# Patient Record
Sex: Female | Born: 1955 | Race: White | Hispanic: No | Marital: Married | Smoking: Never smoker
Health system: Southern US, Community
[De-identification: ages and names within clinical notes are randomized; demographics above are authoritative.]

## PROBLEM LIST (undated history)

## (undated) DIAGNOSIS — R5383 Other fatigue: Secondary | ICD-10-CM

## (undated) DIAGNOSIS — C50919 Malignant neoplasm of unspecified site of unspecified female breast: Secondary | ICD-10-CM

## (undated) DIAGNOSIS — R112 Nausea with vomiting, unspecified: Secondary | ICD-10-CM

## (undated) DIAGNOSIS — Z923 Personal history of irradiation: Secondary | ICD-10-CM

## (undated) DIAGNOSIS — R109 Unspecified abdominal pain: Secondary | ICD-10-CM

## (undated) DIAGNOSIS — R5381 Other malaise: Secondary | ICD-10-CM

## (undated) DIAGNOSIS — D649 Anemia, unspecified: Secondary | ICD-10-CM

## (undated) DIAGNOSIS — M5030 Other cervical disc degeneration, unspecified cervical region: Secondary | ICD-10-CM

## (undated) DIAGNOSIS — Z1331 Encounter for screening for depression: Secondary | ICD-10-CM

## (undated) DIAGNOSIS — E039 Hypothyroidism, unspecified: Secondary | ICD-10-CM

## (undated) DIAGNOSIS — K219 Gastro-esophageal reflux disease without esophagitis: Secondary | ICD-10-CM

## (undated) DIAGNOSIS — J069 Acute upper respiratory infection, unspecified: Secondary | ICD-10-CM

## (undated) DIAGNOSIS — J309 Allergic rhinitis, unspecified: Secondary | ICD-10-CM

## (undated) DIAGNOSIS — R300 Dysuria: Secondary | ICD-10-CM

## (undated) DIAGNOSIS — G43009 Migraine without aura, not intractable, without status migrainosus: Secondary | ICD-10-CM

## (undated) DIAGNOSIS — R350 Frequency of micturition: Secondary | ICD-10-CM

## (undated) DIAGNOSIS — K5909 Other constipation: Secondary | ICD-10-CM

## (undated) DIAGNOSIS — R110 Nausea: Secondary | ICD-10-CM

## (undated) DIAGNOSIS — I872 Venous insufficiency (chronic) (peripheral): Secondary | ICD-10-CM

## (undated) DIAGNOSIS — I517 Cardiomegaly: Secondary | ICD-10-CM

## (undated) DIAGNOSIS — R0609 Other forms of dyspnea: Secondary | ICD-10-CM

## (undated) DIAGNOSIS — J029 Acute pharyngitis, unspecified: Secondary | ICD-10-CM

## (undated) DIAGNOSIS — K449 Diaphragmatic hernia without obstruction or gangrene: Secondary | ICD-10-CM

## (undated) DIAGNOSIS — Z9889 Other specified postprocedural states: Secondary | ICD-10-CM

## (undated) HISTORY — DX: Other malaise: R53.83

## (undated) HISTORY — DX: Other forms of dyspnea: R06.09

## (undated) HISTORY — DX: Gastro-esophageal reflux disease without esophagitis: K21.9

## (undated) HISTORY — DX: Dysuria: R30.0

## (undated) HISTORY — DX: Acute upper respiratory infection, unspecified: J06.9

## (undated) HISTORY — DX: Frequency of micturition: R35.0

## (undated) HISTORY — DX: Diaphragmatic hernia without obstruction or gangrene: K44.9

## (undated) HISTORY — DX: Other constipation: K59.09

## (undated) HISTORY — DX: Other malaise: R53.81

## (undated) HISTORY — DX: Allergic rhinitis, unspecified: J30.9

## (undated) HISTORY — DX: Encounter for screening for depression: Z13.31

## (undated) HISTORY — DX: Venous insufficiency (chronic) (peripheral): I87.2

## (undated) HISTORY — PX: ABDOMINAL HYSTERECTOMY: SHX81

## (undated) HISTORY — DX: Hypothyroidism, unspecified: E03.9

## (undated) HISTORY — DX: Personal history of irradiation: Z92.3

## (undated) HISTORY — DX: Migraine without aura, not intractable, without status migrainosus: G43.009

## (undated) HISTORY — DX: Cardiomegaly: I51.7

## (undated) HISTORY — DX: Unspecified abdominal pain: R10.9

## (undated) HISTORY — DX: Other cervical disc degeneration, unspecified cervical region: M50.30

## (undated) HISTORY — DX: Nausea: R11.0

## (undated) HISTORY — DX: Acute pharyngitis, unspecified: J02.9

---

## 1998-06-11 ENCOUNTER — Other Ambulatory Visit: Admission: RE | Admit: 1998-06-11 | Discharge: 1998-06-11 | Payer: Self-pay | Admitting: Gynecology

## 1998-07-13 ENCOUNTER — Other Ambulatory Visit: Admission: RE | Admit: 1998-07-13 | Discharge: 1998-07-13 | Payer: Self-pay | Admitting: Gynecology

## 1998-10-24 ENCOUNTER — Other Ambulatory Visit: Admission: RE | Admit: 1998-10-24 | Discharge: 1998-10-24 | Payer: Self-pay | Admitting: Gynecology

## 1999-06-05 ENCOUNTER — Other Ambulatory Visit: Admission: RE | Admit: 1999-06-05 | Discharge: 1999-06-05 | Payer: Self-pay | Admitting: Gynecology

## 1999-06-11 ENCOUNTER — Encounter: Admission: RE | Admit: 1999-06-11 | Discharge: 1999-06-11 | Payer: Self-pay | Admitting: Gynecology

## 1999-06-11 ENCOUNTER — Encounter: Payer: Self-pay | Admitting: Gynecology

## 2000-06-08 ENCOUNTER — Other Ambulatory Visit: Admission: RE | Admit: 2000-06-08 | Discharge: 2000-06-08 | Payer: Self-pay | Admitting: Gynecology

## 2000-06-12 ENCOUNTER — Encounter: Admission: RE | Admit: 2000-06-12 | Discharge: 2000-06-12 | Payer: Self-pay | Admitting: Gynecology

## 2000-06-12 ENCOUNTER — Encounter: Payer: Self-pay | Admitting: Gynecology

## 2001-02-26 ENCOUNTER — Ambulatory Visit (HOSPITAL_COMMUNITY): Admission: RE | Admit: 2001-02-26 | Discharge: 2001-02-26 | Payer: Self-pay | Admitting: Gastroenterology

## 2001-06-07 ENCOUNTER — Other Ambulatory Visit: Admission: RE | Admit: 2001-06-07 | Discharge: 2001-06-07 | Payer: Self-pay | Admitting: Gynecology

## 2001-06-23 ENCOUNTER — Encounter: Payer: Self-pay | Admitting: Gynecology

## 2001-06-23 ENCOUNTER — Encounter: Admission: RE | Admit: 2001-06-23 | Discharge: 2001-06-23 | Payer: Self-pay | Admitting: Gynecology

## 2001-12-03 ENCOUNTER — Ambulatory Visit (HOSPITAL_COMMUNITY): Admission: RE | Admit: 2001-12-03 | Discharge: 2001-12-03 | Payer: Self-pay | Admitting: *Deleted

## 2001-12-03 ENCOUNTER — Encounter (INDEPENDENT_AMBULATORY_CARE_PROVIDER_SITE_OTHER): Payer: Self-pay | Admitting: Specialist

## 2002-01-20 HISTORY — PX: SALPINGOOPHORECTOMY: SHX82

## 2002-06-13 ENCOUNTER — Other Ambulatory Visit: Admission: RE | Admit: 2002-06-13 | Discharge: 2002-06-13 | Payer: Self-pay | Admitting: Gynecology

## 2002-06-28 ENCOUNTER — Encounter: Admission: RE | Admit: 2002-06-28 | Discharge: 2002-06-28 | Payer: Self-pay | Admitting: Gynecology

## 2002-06-28 ENCOUNTER — Encounter: Payer: Self-pay | Admitting: Gynecology

## 2002-11-08 ENCOUNTER — Encounter (INDEPENDENT_AMBULATORY_CARE_PROVIDER_SITE_OTHER): Payer: Self-pay

## 2002-11-09 ENCOUNTER — Inpatient Hospital Stay (HOSPITAL_COMMUNITY): Admission: RE | Admit: 2002-11-09 | Discharge: 2002-11-10 | Payer: Self-pay | Admitting: Gynecology

## 2003-01-21 HISTORY — PX: LUMBAR LAMINECTOMY: SHX95

## 2003-06-15 ENCOUNTER — Other Ambulatory Visit: Admission: RE | Admit: 2003-06-15 | Discharge: 2003-06-15 | Payer: Self-pay | Admitting: Gynecology

## 2003-06-29 ENCOUNTER — Encounter: Admission: RE | Admit: 2003-06-29 | Discharge: 2003-06-29 | Payer: Self-pay | Admitting: Gynecology

## 2003-08-01 ENCOUNTER — Encounter: Admission: RE | Admit: 2003-08-01 | Discharge: 2003-08-01 | Payer: Self-pay | Admitting: Family Medicine

## 2003-08-02 ENCOUNTER — Encounter: Admission: RE | Admit: 2003-08-02 | Discharge: 2003-08-02 | Payer: Self-pay | Admitting: Family Medicine

## 2003-08-06 ENCOUNTER — Emergency Department (HOSPITAL_COMMUNITY): Admission: EM | Admit: 2003-08-06 | Discharge: 2003-08-06 | Payer: Self-pay | Admitting: Emergency Medicine

## 2003-08-07 ENCOUNTER — Ambulatory Visit (HOSPITAL_COMMUNITY): Admission: RE | Admit: 2003-08-07 | Discharge: 2003-08-08 | Payer: Self-pay | Admitting: Neurosurgery

## 2004-01-21 HISTORY — PX: THYROID SURGERY: SHX805

## 2004-06-19 ENCOUNTER — Other Ambulatory Visit: Admission: RE | Admit: 2004-06-19 | Discharge: 2004-06-19 | Payer: Self-pay | Admitting: Gynecology

## 2004-07-01 ENCOUNTER — Encounter: Admission: RE | Admit: 2004-07-01 | Discharge: 2004-07-01 | Payer: Self-pay | Admitting: Gynecology

## 2004-07-05 ENCOUNTER — Encounter: Admission: RE | Admit: 2004-07-05 | Discharge: 2004-07-05 | Payer: Self-pay | Admitting: Gynecology

## 2004-07-12 ENCOUNTER — Encounter: Admission: RE | Admit: 2004-07-12 | Discharge: 2004-07-12 | Payer: Self-pay | Admitting: Gynecology

## 2004-07-31 ENCOUNTER — Encounter (INDEPENDENT_AMBULATORY_CARE_PROVIDER_SITE_OTHER): Payer: Self-pay | Admitting: *Deleted

## 2004-07-31 ENCOUNTER — Encounter: Admission: RE | Admit: 2004-07-31 | Discharge: 2004-07-31 | Payer: Self-pay | Admitting: Gynecology

## 2004-07-31 ENCOUNTER — Other Ambulatory Visit: Admission: RE | Admit: 2004-07-31 | Discharge: 2004-07-31 | Payer: Self-pay | Admitting: Interventional Radiology

## 2004-08-16 ENCOUNTER — Emergency Department (HOSPITAL_COMMUNITY): Admission: EM | Admit: 2004-08-16 | Discharge: 2004-08-17 | Payer: Self-pay | Admitting: Emergency Medicine

## 2004-10-04 ENCOUNTER — Encounter (INDEPENDENT_AMBULATORY_CARE_PROVIDER_SITE_OTHER): Payer: Self-pay | Admitting: Specialist

## 2004-10-04 ENCOUNTER — Ambulatory Visit (HOSPITAL_COMMUNITY): Admission: RE | Admit: 2004-10-04 | Discharge: 2004-10-04 | Payer: Self-pay | Admitting: Endocrinology

## 2004-11-19 ENCOUNTER — Encounter (INDEPENDENT_AMBULATORY_CARE_PROVIDER_SITE_OTHER): Payer: Self-pay | Admitting: Specialist

## 2004-11-19 ENCOUNTER — Ambulatory Visit (HOSPITAL_COMMUNITY): Admission: RE | Admit: 2004-11-19 | Discharge: 2004-11-20 | Payer: Self-pay | Admitting: *Deleted

## 2005-06-24 ENCOUNTER — Other Ambulatory Visit: Admission: RE | Admit: 2005-06-24 | Discharge: 2005-06-24 | Payer: Self-pay | Admitting: Gynecology

## 2005-07-03 ENCOUNTER — Encounter: Admission: RE | Admit: 2005-07-03 | Discharge: 2005-07-03 | Payer: Self-pay | Admitting: Gynecology

## 2006-01-20 HISTORY — PX: SHOULDER ARTHROSCOPY: SHX128

## 2006-07-02 ENCOUNTER — Other Ambulatory Visit: Admission: RE | Admit: 2006-07-02 | Discharge: 2006-07-02 | Payer: Self-pay | Admitting: Gynecology

## 2006-07-09 ENCOUNTER — Encounter: Admission: RE | Admit: 2006-07-09 | Discharge: 2006-07-09 | Payer: Self-pay | Admitting: Gynecology

## 2006-08-04 ENCOUNTER — Ambulatory Visit (HOSPITAL_COMMUNITY): Admission: RE | Admit: 2006-08-04 | Discharge: 2006-08-05 | Payer: Self-pay | Admitting: Orthopedic Surgery

## 2007-06-02 ENCOUNTER — Encounter: Admission: RE | Admit: 2007-06-02 | Discharge: 2007-06-02 | Payer: Self-pay | Admitting: Gynecology

## 2007-08-05 ENCOUNTER — Encounter: Admission: RE | Admit: 2007-08-05 | Discharge: 2007-08-05 | Payer: Self-pay | Admitting: Gynecology

## 2007-08-11 ENCOUNTER — Encounter: Admission: RE | Admit: 2007-08-11 | Discharge: 2007-08-11 | Payer: Self-pay | Admitting: Gynecology

## 2008-02-25 ENCOUNTER — Encounter: Admission: RE | Admit: 2008-02-25 | Discharge: 2008-02-25 | Payer: Self-pay | Admitting: Gynecology

## 2008-07-17 ENCOUNTER — Encounter: Admission: RE | Admit: 2008-07-17 | Discharge: 2008-07-17 | Payer: Self-pay | Admitting: Endocrinology

## 2008-08-07 ENCOUNTER — Encounter: Admission: RE | Admit: 2008-08-07 | Discharge: 2008-08-07 | Payer: Self-pay | Admitting: Gynecology

## 2009-08-08 ENCOUNTER — Encounter: Admission: RE | Admit: 2009-08-08 | Discharge: 2009-08-08 | Payer: Self-pay | Admitting: Gynecology

## 2010-04-24 ENCOUNTER — Ambulatory Visit
Admission: RE | Admit: 2010-04-24 | Discharge: 2010-04-24 | Disposition: A | Discharge status: Home / Self Care | Payer: 59 | Source: Ambulatory Visit | Attending: Internal Medicine | Admitting: Internal Medicine

## 2010-04-24 ENCOUNTER — Other Ambulatory Visit: Payer: Self-pay | Admitting: Internal Medicine

## 2010-04-24 DIAGNOSIS — R1031 Right lower quadrant pain: Secondary | ICD-10-CM

## 2010-04-24 MED ORDER — IOHEXOL 300 MG/ML  SOLN
100.0000 mL | Freq: Once | INTRAMUSCULAR | Status: AC | PRN
  Administered 2010-04-24: 100 mL via INTRAVENOUS

## 2010-06-04 NOTE — Op Note (Signed)
Jennifer Rodgers, Jennifer Rodgers                 ACCOUNT NO.:  [id number]   MEDICAL RECORD NO.:  [id number]          PATIENT TYPE:  AMB   LOCATION:  DAY                          FACILITY:  [facility]   PHYSICIAN:  Marlowe Kays, M.D.  DATE OF BIRTH:  01-Jun-1955   DATE OF PROCEDURE:  08/04/2006  DATE OF DISCHARGE:                               OPERATIVE REPORT   PREOPERATIVE DIAGNOSIS:  Torn rotator cuff, left shoulder.   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   OPERATION:  Anterior acromionectomy, exploration of rotator cuff with  excision of exuberant bursitis and injection of glenohumeral joint with  methylene blue.   SURGEON:  [place], M.D.   ASSISTANT:  Mr. Idolina Primer, P.A.-C.   ANESTHESIA:  General.   PATHOLOGY AND INDICATIONS FOR PROCEDURE:  Because of left shoulder pain,  she had an MRI performed on June 22, 2006 which I have personally  reviewed which showed a fairly significant rotator cuff tear along the  axis of the tendon which appeared to be more of a filleting type of tear  with extensive linear tears.  The tendon appeared to be shredded but not  retracted.  See operative description below for details at surgery.   PROCEDURE:  Prophylactic antibiotics, satisfactory general anesthesia,  time-out performed, beach-chair position on the [place] frame.  Left  shoulder girdle was prepped with DuraPrep and draped in a sterile field.  Vertical incision from roughly the [facility] joint down to the greater  tuberosity.  Fascia over the anterior acromion was opened in line with  the skin incision with cutting cautery.  On undermining the anterior  acromion, no joint fluid came forth.  I performed my initial anterior  acromionectomy with a micro saw protecting the undersurface of the  rotator cuff and then made additional decompression with a saw and  rongeur so that she had a thorough subacromial decompression.  She had  extensive  subdeltoid bursitis in which I excised with scissors.  Some  areas of bleeding were corrected with cautery.  This was more of  bursitis and almost a reparative type bursal tissue.  On inspection of  the rotator cuff, I could not find any definite tears anteriorly and in  the supraspinatus which was where the abnormalities were felt to lie on  the MRI.  There was some scarring which was suggestive healed rotator  cuff tears.  I injected methylene blue mixed with saline through the  anterior rotator cuff into the joint.  There was good pressure  maintained and no leakage of the dye which indicated to me that the  tears had healed.  Accordingly, with impingement corrected and no tear  to repair, I felt that the goals of the surgery had been accomplished.  I irrigated the wound well with sterile saline, infiltrated the soft  tissues with half percent Marcaine with adrenalin.  The deltoid was  repaired with interrupted #1  Vicryl as was the fascia over the anterior acromion, subcutaneous tissue  with 2-0 Vicryl, Steri-Strips on skin.  Dry sterile dressing and  shoulder  immobilizer applied.  She tolerated the procedure well and was  taken to recovery in satisfactory condition with no known complications.           ______________________________  Marlowe Kays, M.D.     JA/MEDQ  D:  08/04/2006  T:  08/04/2006  Job:  [id number]

## 2010-06-07 NOTE — Op Note (Signed)
   NAME:  Jennifer Rodgers, Jennifer Rodgers                           ACCOUNT NO.:  [id number]   MEDICAL RECORD NO.:  [id number]                   PATIENT TYPE:  AMB   LOCATION:  DAY                                  FACILITY:  [facility]   PHYSICIAN:  Vikki Ports, M.D.         DATE OF BIRTH:  10-24-1955   DATE OF PROCEDURE:  12/03/2001  DATE OF DISCHARGE:                                 OPERATIVE REPORT   PREOPERATIVE DIAGNOSIS:  Right groin mass.   POSTOPERATIVE DIAGNOSIS:  Right groin mass.   PROCEDURE:  Excision right groin lipoma.   ANESTHESIA:  Local MAC.   SURGEON:  Vikki Ports, M.D.   DESCRIPTION OF PROCEDURE:  The patient was taken to the operating room,  placed in the supine position. After adequate MAC anesthesia was induced,  the right groin was prepped and draped in normal sterile fashion. The skin  and subcu was anesthetized using 0.5 Marcaine with epinephrine. A small  incision was made just in the inguinal fold just under the dermis. A well  encapsulated lipoma was seen, it was delivered up and out through the wound  using blunt and sharp dissection. Adequate hemostasis was ensured and the  skin was closed with a 4-0 Monocryl and Dermabond was placed. The patient  tolerated the procedure well and went to PACU in good condition.                                               Vikki Ports, M.D.    KRH/MEDQ  D:  12/03/2001  T:  12/03/2001  Job:  [id number]

## 2010-06-07 NOTE — Op Note (Signed)
NAME:  Jennifer Rodgers, Jennifer Rodgers                           ACCOUNT NO.:  [id number]   MEDICAL RECORD NO.:  [id number]                   PATIENT TYPE:  OIB   LOCATION:  3006                                 FACILITY:  [facility]   PHYSICIAN:  Coletta Memos, M.D.                  DATE OF BIRTH:  1955/10/03   DATE OF PROCEDURE:  08/07/2003  DATE OF DISCHARGE:                                 OPERATIVE REPORT   PREOPERATIVE DIAGNOSES:  1. Bilateral disk herniation at L4-5 right.  2. Right L4 radiculopathy.   POSTOPERATIVE DIAGNOSIS:  1. Bilateral disk herniation at L4-5 right.  2. Right L4 radiculopathy.   OPERATION PERFORMED:  Right L4-5 far lateral diskectomy with  microdissection, partial superior facetectomy, L5.   SURGEON:  Coletta Memos, M.D.   ASSISTANT:  Hewitt Shorts, M.D.   ANESTHESIA:  General.   COMPLICATIONS:  None.   INDICATIONS FOR PROCEDURE:  [name] is a 55 year old who has been in  severe pain in the right lower extremity for the last few days.  The pain  was so severe she said she had to go to the emergency room yesterday.  MRI  strongly suggests far lateral disk herniation at L4-5.  I therefore  recommended and she agreed to undergo operative decompression.   DESCRIPTION OF PROCEDURE:  [name] was brought to the operating room.  She  was intubated and placed under general anesthetic without difficulty.  She  was rolled prone onto a Wilson frame and all pressure points were properly  padded.  Her skin was prepped and she was draped in sterile fashion.  I  infiltrated 20 mL of 0.5% lidocaine and 1:200,000 strength epinephrine into  the lumbar region and paraspinous musculature on the right side.  I opened  the skin with a #10 blade and took this down to the thoracolumbar fascia.  I  then exposed the lamina of L4 and of L3.  Using x-ray as a guide, I was able  to positively identify that I was inferior to the lamina of L3.  I then took  that opportunity to expose the  pars interarticularis of L4.  I used a high  speed drill and Kerrison punches to do a partial removal of the lateral  aspect of the pars of L4. I also removed a small portion of the superior  facet at L4-5.  With the microscope and using microdissection, I was able to  identify the nerve root. I took another x-ray to ensure that I was at the  correct disk space and I was.  I then was able to appreciate what was a very  large hump anterior to the L4 nerve root.  I was able to open that with a  simple nerve hook.  I then removed large portions of disk material.  This  was done with Dr. Earl Gala assistance.  We then inspected the  nerve root  and there was obvious decompression  of the nerve root and I felt that there was no other compression present.  I  then irrigated the wound.  I then placed Depo-Medrol and fentanyl over the  operative site.  I then closed the wound in layered fashion with Dr.  Earl Gala assistance using Vicryl sutures.  Dermabond was used for sterile  dressing.  The patient tolerated the procedure well.                                               Coletta Memos, M.D.    KC/MEDQ  D:  08/07/2003  T:  08/08/2003  Job:  [id number]

## 2010-06-07 NOTE — Op Note (Signed)
Jennifer Rodgers, Jennifer Rodgers                 ACCOUNT NO.:  [id number]   MEDICAL RECORD NO.:  [id number]          PATIENT TYPE:  OIB   LOCATION:  0098                         FACILITY:  [facility]   PHYSICIAN:  Sharlet Salina T. Hoxworth, M.D.DATE OF BIRTH:  1955/10/27   DATE OF PROCEDURE:  DATE OF DISCHARGE:  11/19/2004                                 OPERATIVE REPORT   PREOPERATIVE DIAGNOSIS:  Left thyroid nodule.   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   SURGICAL PROCEDURES:  Left thyroid lobectomy.   SURGEON:  Lorne Skeens. Hoxworth, M.D.   ASSISTANT:  Anselm Pancoast. Zachery Dakins, M.D.   ANESTHESIA:  General.   BRIEF HISTORY:  Ms. Bultman is a 55 year old female who has presented with a  left thyroid nodule that has enlarged rather rapidly over several months.  She now has an approximately 3 cm discrete solid left thyroid nodule.  She  has had two fine-needle aspiration biopsies showing follicular and Hurthle  cells and follicular variant of papillary cancer could not be ruled out.  Due to size a rapid growth and findings on biopsy and physical examination,  we  elected proceed with left thyroid lobectomy and possible total  thyroidectomy depending on the frozen section findings. Of note is the  patient has been somewhat hoarse in recent weeks and has was noted to be  hoarse in the office on preoperative physical exam.   DESCRIPTION OF PROCEDURE:  The patient was brought to the operating room and  placed in the supine position on the operating table.  General endotracheal  anesthesia was induced.  She was carefully positioned with her neck extended  and the entire anterior neck and upper chest up to the jaw were sterilely  prepped and draped.  Correct patient and procedure were verified.  The  curvilinear incision was made in the skin crease two fingerbreadths above  the sternal notch transversely and dissection carried down through the  subcutaneous tissue and  platysma using cautery.  Subplatysmal flaps were  then raised superiorly up to the thyroid cartilage and inferiorly down to  the sternal notch and laterally out to the sternocleidomastoid muscles.  The  Moore retractor was placed.  The strap muscles were divided along the  midline with cautery, and the anterior left lobe of thyroid was exposed. The  left lobe was carefully exposed with blunt dissection up. There was a good-  sized solid nodule in the mid left lobe.  The middle thyroid vein was  divided between clips. The vein was further mobilized up anteriorly.  The  superior pole was then exposed and mobilized out to the superior vessels  which were ligated in continuity with 2-0 silk.  Following this, the vessels  were divided between two clamps and further tied with 2-0 silk and the upper  pole was further mobilized with cautery.  Careful dissection was then  performed in the tracheoesophageal groove and up toward the inferior thyroid  vessels, and we were able to clearly identify the recurrent laryngeal nerve  on the left along its course and it was  visualized and protected throughout  the remainder of the dissection.  Of note, I did see what appeared to be a  normal-appearing parathyroid gland near the upper pole dissection, which was  left in place.  Dissecting near the individual branches of the inferior  thyroid artery, we did encounter a subdural mass about 0.75 cm which  appeared initially consistent with possibly enlarged parathyroid gland.  Due  to its size, we did a biopsy of this, and this showed normal lymph node.  Following this was excised with the rest of the specimen. Individual small  branches of the inferior thyroid artery were then divided between clips.  The gland was mobilized up out of the tracheoesophageal groove. Further  attachments inferiorly well away from the nerve were divided with cautery  between clips.  Finally, the isthmus gland was mobilized anteriorly  with the  nerve inside.  The suspensory ligament of Allyson Sabal was further divided with  cautery and the gland mobilized to the isthmus which was divided between  clamps and the specimen removed. Frozen section on the nodule showed a  benign lymph node as noted above.  Frozen section on the thyroid gland  itself revealed a benign-appearing Hurthle cell lesion most consistent with  a Hurthle cell adenoma.  The right lobe of the thyroid had been exposed  mobilized away for strap muscles, and it was small without any nodularity or  abnormality noted.  We elected to leave the right thyroid lobe.  The neck  was irrigated and complete hemostasis assured.  A small Surgicel patch was  placed in the bed of the left thyroid lobe. The strap muscles were then  closed in the midline with interrupted 3-0 Vicryl.  The platysma was closed  with interrupted 3-0 Vicryl, and the skin was closed with staples and Steri-  Strips.  Sponge, needle and instrument counts were correct.  Dry, sterile  dressings were applied, and the patient taken to recovery in good condition.      Lorne Skeens. Hoxworth, M.D.  Electronically Signed     BTH/MEDQ  D:  11/19/2004  T:  11/19/2004  Job:  [id number]   cc:   Leonie Man, M.D.  [address]  [address]  [place]  [place] [address]

## 2010-06-07 NOTE — Op Note (Signed)
NAME:  Jennifer Rodgers, Jennifer Rodgers                           ACCOUNT NO.:  [id number]   MEDICAL RECORD NO.:  [id number]                   PATIENT TYPE:  AMB   LOCATION:  DAY                                  FACILITY:  [facility]   PHYSICIAN:  Gretta Cool, M.D.              DATE OF BIRTH:  [birth year]   DATE OF PROCEDURE:  DATE OF DISCHARGE:                                 OPERATIVE REPORT   PREOPERATIVE DIAGNOSES:  1. Incapacitating cyclic pelvic pain.  2. History of hysteroscopy resection ablation for endometrial polyps in     1999.  3. Ultrasound evidence of adenomyosis.   POSTOPERATIVE DIAGNOSES:  1. Incapacitating cyclic pelvic pain.  2. History of hysteroscopy resection ablation for endometrial polyps in     1999.  3. Ultrasound evidence of adenomyosis.   PROCEDURE:  Total abdominal hysterectomy and bilateral salpingo-  oophorectomy.   SURGEON:  Gretta Cool, M.D.   ASSISTANT:  Raynald Kemp, M.D.   ANESTHESIA:  General orotracheal.   DESCRIPTION OF PROCEDURE:  Under excellent general anesthesia with the  patient's abdomen prepped and draped as a sterile field, a Pfannenstiel  incision was made and the incision extended through the fascia.  The abdomen  was then opened and the abdomen explored.  There were no abnormalities  identified in the upper abdomen.  Examination of the pelvis revealed small  amount of endometriosis involving both ovaries, both ovaries relatively  quiescent, small fibroids present on the uterine wall.  The uterus was then  grasped with Kelly clamps across the parametrium.  The round ligaments were  then transected by cautery.  The anterior leaf of the broad ligament also  opened via cautery.  The bladder was pushed off the lower uterine segment.  The infundibulopelvic vessels on each side were then clamped, cut, sutured  and tied with 0-Vicryl.  The uterine vessels were then skeletonized,  clamped, cut, and sutured and tied with 0-Vicryl.  The cardinal  and  uterosacral ligaments were then progressively clamped, cut, sutured and tied  with 0-Vicryl.  The angle of the vagina was then clamped across and the  cervix excised.  The cuff was closed with a running suture of #0 Vicryl.  A  cardinal uterosacral colposuspension was performed so as to prevent vault  prolapse using 0-Ethibond.  At this point, the pelvic floor was re-  peritonealized with running suture of 2-0 Monocryl.  The packs and  retractors were then removed, pelvis irrigated to remove all debris.  The  omentum was pulled down over the pelvis.  The abdominal peritoneum was then  closed with a running suture of 0-Monocryl.  The rectus muscles were  plicated in the midline also with 0-Monocryl.  The fascia was approximated  with running suture of 0-Monocryl from each angle to midline.  Subcutaneous  tissue was  approximated with interrupted sutures of 3-0 Vicryl and skin closed with  skin staples and Steri-Strips.  At the end of the procedure, sponge and lap  counts are correct.  There were no complications.  Patient returned to the  recovery room in excellent condition.                                               Gretta Cool, M.D.    CWL/MEDQ  D:  11/08/2002  T:  11/08/2002  Job:  [id number]   cc:   Hulan Saas, M.D.  [place], [place]

## 2010-06-07 NOTE — H&P (Signed)
NAME:  Jennifer Rodgers, Jennifer Rodgers                           ACCOUNT NO.:  [id number]   MEDICAL RECORD NO.:  [id number]                   PATIENT TYPE:  AMB   LOCATION:  DAY                                  FACILITY:  [facility]   PHYSICIAN:  Gretta Cool, M.D.              DATE OF BIRTH:  July 23, 1955   DATE OF ADMISSION:  11/07/2002  DATE OF DISCHARGE:                                HISTORY & PHYSICAL   CHIEF COMPLAINT:  Incapacitating cyclic pelvic pain.   HISTORY OF PRESENT ILLNESS:  A 55 year old G1, P1 with a history of  hysteroscopy resection of endometrial polyps and endometrial ablation in  1999. She did well for approximately 4 years until she began developing  severe and increasingly severe cyclic pelvic pain with onset of pain several  days prior to onset of bleeding and then relief with onset of menstrual  flow. On ultrasound examination she has some evidence of loculation of  endometrium out in the uterus, which probably represents significant  adenomyosis. She does not feel that she can continue to tolerate this  recurring discomfort and is now admitted for definitive therapy by  hysterectomy.   PAST MEDICAL HISTORY:  Usual childhood disease without sequelae. No medical  illnesses.   PAST SURGICAL HISTORY:  Nasal surgery in 1973. D&C hysteroscopy in 1999. D&C  in 1995. Excision of lipoma of her groin 2003.   FAMILY HISTORY:  Mother is living and well at age 88. She does have thyroid  problems and arthritis. Father is unknown. Three sisters living and well.   ALLERGIES:  No known drug allergies.   PRESENT MEDICATIONS:  None.   REVIEW OF SYSTEMS:  HEENT:  Denies symptoms. CARDIORESPIRATORY:  Denies  asthma, cough, bronchitis, shortness of breath.  GASTROINTESTINAL/GENITOURINARY:  Denies frequency, urgency, dysuria, change  in bowel habits or food intolerance.   PHYSICAL EXAMINATION:  GENERAL:  A well developed, well nourished, white  female.  HEENT:  Pupils are equal, round,  and reactive to light and accommodation.  Fundi are not examined. Oropharynx clear.  NECK:  Supple without mass or thyroid enlargement.  CHEST:  Clear to auscultation and percussion.  HEART:  Regular rhythm without murmur or cardiac enlargement.  BREAST:  Soft without masses. No nipple discharge.  ABDOMEN:  Soft without mass or organomegaly. No abdominal scars.  PELVIC:  External genitalia normal female. Vagina clean and rugose. Cervix  is parous clean. Uterus normal size, shape, and contour. Adnexa clear.  Rectovaginal confirms. Note, she has some posterior support weakness.  Otherwise, reasonably well preserved support.  EXTREMITIES:  Negative.  NEUROLOGIC:  Physiologic.    IMPRESSION:  1. Incapacitating cyclic pelvic pain, increasingly severe and now     incapacitating, probably secondary to adenomyosis.  2. History of hysteroscopy ablation of endometrium, resection of endometrial     polyp in 1999.   PLAN:  Laparoscopy assisted hysterectomy versus total abdominal hysterectomy  and bilateral salpingo-oophorectomy. She understands the risks, benefits,  alternative therapies of. She is now admitted for definitive therapy.                                               Gretta Cool, M.D.    CWL/MEDQ  D:  11/07/2002  T:  11/08/2002  Job:  [id number]

## 2010-06-07 NOTE — Discharge Summary (Signed)
NAME:  LADAJA, YUSUPOV                           ACCOUNT NO.:  [id number]   MEDICAL RECORD NO.:  [id number]                   PATIENT TYPE:  INP   LOCATION:  0446                                 FACILITY:  [facility]   PHYSICIAN:  Gretta Cool, M.D.              DATE OF BIRTH:  1955/08/01   DATE OF ADMISSION:  11/08/2002  DATE OF DISCHARGE:  11/10/2002                                 DISCHARGE SUMMARY   HISTORY OF PRESENT ILLNESS:  Ms. Rahm is a 55 year old female gravida 1,  para 1 with a history of hysteroscopic resection of endometrial polyps and  endometrial ablation in 1999.  She did well for approximately four years  when she began to develop increasing severe cyclic pelvic pain with the  onset of pain several days prior to bleeding and complete relief with the  onset of menstrual flow.  Ultrasound examination revealed evidence of  loculation of the endometrium out of the uterus which represents most likely  adenomyosis.  She feels the pain is intolerable and wishes definitive  therapy by hysterectomy.   PHYSICAL EXAMINATION:  CHEST:  Clear to A&P.  HEART:  Rate and rhythm regular without murmur, gallop, or cardiac  enlargement.  ABDOMEN:  Soft and scaphoid without masses or organomegaly.  PELVIC:  External genitalia within normal limits for female.  Vagina clean  and rugose.  Cervix appears clean.  Uterus is normal size, shape, and  contour.  Adnexa bilaterally clear.  Rectovaginal examination confirms.  She  has some posterior support weakness, otherwise reasonably well preserved  support.   IMPRESSION:  1. Incapacitating cyclic pelvic pain, increasing in severity probably     secondary to adenomyosis.  2. History of hysteroscopy, ablation of endometriosis, resection of     endometrial polyps in 1999.   PLAN:  Laparoscopically assisted hysterectomy versus total abdominal  hysterectomy, bilateral salpingo-oophorectomy under general anesthesia.  Risks and benefits were  discussed with patient and she accepts these  procedures.   LABORATORY DATA:  Admission hemoglobin 13.1, hematocrit 37.5, white count  4.2.  On first postoperative day hemoglobin was 11.6, hematocrit 33.5.  Serum pregnancy test was negative.  EKG:  Normal sinus rhythm.  Low voltage  QRS.  Borderline EKG.   HOSPITAL COURSE:  The patient underwent total abdominal hysterectomy,  bilateral salpingo-oophorectomy under general anesthesia.  The procedures  were completed without any complications and the patient was returned to the  recovery room in excellent condition.  Pathology report revealed benign  proliferative endometrium, adenomyosis, few uterine leiomyomata, left  fallopian tube with benign peritubal cyst, bilateral ovaries and right  fallopian tube and cervix without pathologic abnormalities.  Her  postoperative course was without complications and she was discharged on the  first postoperative day in excellent condition.   DISCHARGE INSTRUCTIONS:  No heavy lifting, straining.  No vaginal entrance.  Increase ambulation as tolerated.  She is to call  for any fever over 100.4  or failure of daily improvement.   DIET:  Regular.   DISCHARGE MEDICATIONS:  1. Tylox one p.o. q.4-6h. p.r.n. discomfort.  2. Bextra 10 mg daily.   FOLLOWUP:  She is to return to the office in one week for follow-up.   CONDITION ON DISCHARGE:  Excellent.   FINAL DIAGNOSES:  1. Incapacitating cyclic pelvic pain.  2. History of hysteroscopy with resection, ablation for endometrial polyps     in 1999.  3. Adenomyosis.   PROCEDURE PERFORMED:  Total abdominal hysterectomy, bilateral salpingo-  oophorectomy under general anesthesia.     Matt Holmes, N.P.                          Gretta Cool, M.D.    EMK/MEDQ  D:  11/28/2002  T:  11/28/2002  Job:  [id number]   cc:   Quita Skye. Artis Flock, M.D.  [address], [address]  [place]  [place] [address]  Fax: [phone]

## 2010-07-16 ENCOUNTER — Other Ambulatory Visit: Payer: Self-pay | Admitting: Gynecology

## 2010-08-15 ENCOUNTER — Other Ambulatory Visit: Payer: Self-pay | Admitting: Gynecology

## 2010-08-15 DIAGNOSIS — Z1231 Encounter for screening mammogram for malignant neoplasm of breast: Secondary | ICD-10-CM

## 2010-08-22 ENCOUNTER — Ambulatory Visit
Admission: RE | Admit: 2010-08-22 | Discharge: 2010-08-22 | Disposition: A | Discharge status: Home / Self Care | Payer: 59 | Source: Ambulatory Visit | Attending: Gynecology | Admitting: Gynecology

## 2010-08-22 DIAGNOSIS — Z1231 Encounter for screening mammogram for malignant neoplasm of breast: Secondary | ICD-10-CM

## 2010-11-04 LAB — HEMOGLOBIN AND HEMATOCRIT, BLOOD
HCT: 35.6 — ABNORMAL LOW
Hemoglobin: 12.4

## 2010-11-04 LAB — PROTIME-INR
INR: 1
Prothrombin Time: 13.2

## 2011-03-03 HISTORY — PX: COLONOSCOPY: SHX174

## 2011-08-20 ENCOUNTER — Other Ambulatory Visit: Payer: Self-pay | Admitting: Gynecology

## 2011-08-20 DIAGNOSIS — Z1231 Encounter for screening mammogram for malignant neoplasm of breast: Secondary | ICD-10-CM

## 2011-08-27 ENCOUNTER — Other Ambulatory Visit: Payer: Self-pay | Admitting: Gynecology

## 2011-09-01 ENCOUNTER — Ambulatory Visit
Admission: RE | Admit: 2011-09-01 | Discharge: 2011-09-01 | Disposition: A | Discharge status: Home / Self Care | Payer: 59 | Source: Ambulatory Visit | Attending: Gynecology | Admitting: Gynecology

## 2011-09-01 DIAGNOSIS — Z1231 Encounter for screening mammogram for malignant neoplasm of breast: Secondary | ICD-10-CM

## 2011-09-04 ENCOUNTER — Other Ambulatory Visit: Payer: Self-pay | Admitting: Gynecology

## 2011-09-04 DIAGNOSIS — R928 Other abnormal and inconclusive findings on diagnostic imaging of breast: Secondary | ICD-10-CM

## 2011-09-11 ENCOUNTER — Ambulatory Visit
Admission: RE | Admit: 2011-09-11 | Discharge: 2011-09-11 | Disposition: A | Discharge status: Home / Self Care | Payer: 59 | Source: Ambulatory Visit | Attending: Gynecology | Admitting: Gynecology

## 2011-09-11 DIAGNOSIS — R928 Other abnormal and inconclusive findings on diagnostic imaging of breast: Secondary | ICD-10-CM

## 2012-03-12 ENCOUNTER — Other Ambulatory Visit: Payer: Self-pay | Admitting: Internal Medicine

## 2012-03-12 DIAGNOSIS — N6310 Unspecified lump in the right breast, unspecified quadrant: Secondary | ICD-10-CM

## 2012-04-02 ENCOUNTER — Ambulatory Visit
Admission: RE | Admit: 2012-04-02 | Discharge: 2012-04-02 | Disposition: A | Discharge status: Home / Self Care | Payer: 59 | Source: Ambulatory Visit | Attending: Internal Medicine | Admitting: Internal Medicine

## 2012-04-02 DIAGNOSIS — N6310 Unspecified lump in the right breast, unspecified quadrant: Secondary | ICD-10-CM

## 2012-08-04 ENCOUNTER — Other Ambulatory Visit: Payer: Self-pay

## 2012-08-04 DIAGNOSIS — Z1231 Encounter for screening mammogram for malignant neoplasm of breast: Secondary | ICD-10-CM

## 2012-09-06 ENCOUNTER — Ambulatory Visit
Admission: RE | Admit: 2012-09-06 | Discharge: 2012-09-06 | Disposition: A | Discharge status: Home / Self Care | Payer: 59 | Source: Ambulatory Visit

## 2012-09-06 DIAGNOSIS — Z1231 Encounter for screening mammogram for malignant neoplasm of breast: Secondary | ICD-10-CM

## 2012-09-09 ENCOUNTER — Other Ambulatory Visit: Payer: Self-pay | Admitting: Gynecology

## 2012-09-09 DIAGNOSIS — R928 Other abnormal and inconclusive findings on diagnostic imaging of breast: Secondary | ICD-10-CM

## 2012-09-28 ENCOUNTER — Ambulatory Visit
Admission: RE | Admit: 2012-09-28 | Discharge: 2012-09-28 | Disposition: A | Discharge status: Home / Self Care | Payer: Self-pay | Source: Ambulatory Visit | Attending: Gynecology | Admitting: Gynecology

## 2012-09-28 DIAGNOSIS — R928 Other abnormal and inconclusive findings on diagnostic imaging of breast: Secondary | ICD-10-CM

## 2013-03-03 ENCOUNTER — Ambulatory Visit (INDEPENDENT_AMBULATORY_CARE_PROVIDER_SITE_OTHER): Payer: 59 | Admitting: Neurology

## 2013-03-03 ENCOUNTER — Encounter (INDEPENDENT_AMBULATORY_CARE_PROVIDER_SITE_OTHER): Payer: Self-pay

## 2013-03-03 ENCOUNTER — Encounter: Payer: Self-pay | Admitting: Neurology

## 2013-03-03 VITALS — BP 127/73 | HR 74 | Temp 97.1°F | Ht 64.75 in | Wt 183.0 lb

## 2013-03-03 DIAGNOSIS — R0989 Other specified symptoms and signs involving the circulatory and respiratory systems: Secondary | ICD-10-CM

## 2013-03-03 DIAGNOSIS — R51 Headache: Secondary | ICD-10-CM

## 2013-03-03 DIAGNOSIS — R0683 Snoring: Secondary | ICD-10-CM

## 2013-03-03 DIAGNOSIS — R0609 Other forms of dyspnea: Secondary | ICD-10-CM

## 2013-03-03 DIAGNOSIS — R400 Somnolence: Secondary | ICD-10-CM

## 2013-03-03 DIAGNOSIS — F40298 Other specified phobia: Secondary | ICD-10-CM

## 2013-03-03 DIAGNOSIS — J988 Other specified respiratory disorders: Secondary | ICD-10-CM

## 2013-03-03 DIAGNOSIS — G4710 Hypersomnia, unspecified: Secondary | ICD-10-CM

## 2013-03-03 DIAGNOSIS — E669 Obesity, unspecified: Secondary | ICD-10-CM

## 2013-03-03 DIAGNOSIS — F40240 Claustrophobia: Secondary | ICD-10-CM

## 2013-03-03 DIAGNOSIS — R519 Headache, unspecified: Secondary | ICD-10-CM

## 2013-03-03 NOTE — Patient Instructions (Addendum)
Based on your symptoms and your exam I believe you are at risk for obstructive sleep apnea or OSA, and I think we should proceed with a sleep study to determine whether you do or do not have OSA and how severe it is. If you have mild OSA, we can consider a dental device. If you have moderate to severe OSA, I want you to consider treatment with CPAP. Please remember, the risks and ramifications of moderate to severe obstructive sleep apnea or OSA are: Cardiovascular disease, including congestive heart failure, stroke, difficult to control hypertension, arrhythmias, and even type 2 diabetes has been linked to untreated OSA. Sleep apnea causes disruption of sleep and sleep deprivation in most cases, which, in turn, can cause recurrent headaches, problems with memory, mood, concentration, focus, and vigilance. Most people with untreated sleep apnea report excessive daytime sleepiness, which can affect their ability to drive. Please do not drive if you feel sleepy.  I will see you back after your sleep study to go over the test results and where to go from there. We will call you after your sleep study and to set up an appointment at the time.

## 2013-03-03 NOTE — Progress Notes (Signed)
Subjective:    Patient ID: Jennifer Rodgers is a 58 y.o. female.  HPI    Star Age, MD, PhD [facility] [address], [address] [place], [place] [address]  Dear Dr. Dagmar Hait,   I saw your patient, Jennifer Rodgers, upon your kind request in my neurologic clinic today for initial consultation of her sleep disorder, in particular, concern for obstructive sleep apnea. The patient is unaccompanied today. As you know, Jennifer Rodgers is a very pleasant 58 year old right-handed woman with an underlying medical history of depression, venous insufficiency, allergic rhinitis, chronic constipation, reflux disease with hiatal hernia, lumbar degenerative disc disease (surgery in 2005), hypothyroidism, migraines, and obesity, who reports snoring, difficulty with sleep maintenance, and daytime somnolence. She does not have to go to the bathroom in the night. Her husband snores and that  bothers her. She used to have migraines after her hysterectomy and used to go to the headache [place], and her HAs improved, and she rarely wakes up with a dull, non-migrainous HAs. She has no RLS Sx and does not kick in her sleep. She is not a very restless sleeper. She goes to bed between 10-11 PM and falls asleep quickly and wakes up a couple of times in the night for no reason. She has to get up at 6:30 AM and wakes up poorly rested, feeling tired. She does not nap. She feels tired during the day and her ESS is 10. Her tiredness has been worse in the last 10-12 months.   She has been known to snore for the past few years. Snoring is reportedly moderate, and it is unclear if it is associated with choking sounds and witnessed apneas. The patient denies a sense of choking or strangling feeling. There is some report of nighttime reflux, with frequent nighttime cough experienced. She has coughing throughout the day. She is a non-smoker. She drinks 1-2 glasses of wine per month if at all. There is no family  history of RLS or OSA. She denies cataplexy, sleep paralysis, hypnagogic or hypnopompic hallucinations, or sleep attacks. She does not report any vivid dreams, nightmares, dream enactments, or parasomnias, such as sleep talking or sleep walking. The patient has had a sleep study some 8 years ago, but it was negative at the time. She has gained weight. She consumes 3 caffeinated beverages per day, usually in the form of 2 cups coffee in the morning, and diet green tea, as late as 5-6 PM.  Her bedroom is usually dark and cool. There is TV in the bedroom and usually it is not on at night after 10:30 PM.  She works full time, and has her great grand son with her once week.   Her Past Medical History Is Significant For: Past Medical History  Diagnosis Date  . Other malaise and fatigue   . Screening for depression   . Dysuria   . Nausea alone   . Urinary frequency   . Abdominal pain, unspecified site   . Acute pharyngitis   . Acute upper respiratory infections of unspecified site   . Unspecified venous (peripheral) insufficiency   . Allergic rhinitis, cause unspecified   . Other constipation   . Diaphragmatic hernia without mention of obstruction or gangrene   . Esophageal reflux   . Degeneration of cervical intervertebral disc   . Unspecified hypothyroidism   . Migraine without aura, without mention of intractable migraine without mention of status migrainosus     Her Past Surgical  History Is Significant For: Past Surgical History  Procedure Laterality Date  . Colonoscopy  03/03/2011  . Lumbar laminectomy  2005  . Thyroid surgery  2006    resection of L nodule  . Shoulder arthroscopy Left 2008  . Salpingoophorectomy  2004    Her Family History Is Significant For: Family History  Problem Relation Age of Onset  . Hypertension Mother   . Thyroid disease Mother   . Hypertension Sister   . Hyperlipidemia Sister   . Hyperlipidemia Mother   . Diabetes Sister   . Hypertension Daughter    . Hyperlipidemia Daughter     Her Social History Is Significant For: History   Social History  . Marital Status: Married    Spouse Name: N/A    Number of Children: N/A  . Years of Education: N/A   Social History Main Topics  . Smoking status: Never Smoker   . Smokeless tobacco: None  . Alcohol Use: No  . Drug Use: No  . Sexual Activity: None   Other Topics Concern  . None   Social History Narrative  . None    Her Allergies Are:  Allergies  Allergen Reactions  . Prednisone   :   Her Current Medications Are:  Outpatient Encounter Prescriptions as of 03/03/2013  Medication Sig  . Ascorbic Acid (VITAMIN C PO) Take 1 tablet by mouth daily.  Marland Kitchen ELESTRIN 0.52 MG/0.87 GM (0.06%) GEL Apply 1 application topically daily.  Marland Kitchen levothyroxine (SYNTHROID, LEVOTHROID) 100 MCG tablet Take 100 mcg by mouth daily before breakfast.  . Omega-3 Fatty Acids (FISH OIL CONCENTRATE PO) Take 2 capsules by mouth daily.  Marland Kitchen VITAMIN D, CHOLECALCIFEROL, PO Take 1 tablet by mouth daily.  :  Review of Systems:  Out of a complete 14 point review of systems, all are reviewed and negative with the exception of these symptoms as listed below:   Review of Systems  Constitutional: Positive for fatigue.  HENT: Positive for tinnitus and trouble swallowing.   Eyes: Positive for visual disturbance (blurred vision).  Respiratory: Negative.   Cardiovascular: Negative.   Gastrointestinal: Negative.   Endocrine:       Flushing  Genitourinary: Negative.   Musculoskeletal: Negative.   Skin: Negative.   Neurological: Positive for dizziness.  Hematological: Bruises/bleeds easily.  Psychiatric/Behavioral: Positive for sleep disturbance (snoring).    Objective:  Neurologic Exam  Physical Exam Physical Examination:   Filed Vitals:   03/03/13 0831  BP: 127/73  Pulse: 74  Temp: 97.1 F (36.2 C)    General Examination: The patient is a very pleasant 58 y.o. female in no acute distress. She appears  well-developed and well-nourished and very well groomed.   HEENT: Normocephalic, atraumatic, pupils are equal, round and reactive to light and accommodation. Funduscopic exam is normal with sharp disc margins noted. Extraocular tracking is good without limitation to gaze excursion or nystagmus noted. Normal smooth pursuit is noted. Hearing is grossly intact. Tympanic membranes are clear bilaterally. Face is symmetric with normal facial animation and normal facial sensation. Speech is clear with no dysarthria noted. There is no hypophonia. There is no lip, neck/head, jaw or voice tremor. Neck is supple with full range of passive and active motion. There are no carotid bruits on auscultation. Oropharynx exam reveals: no mouth dryness, adequate dental hygiene and mild airway crowding, due to narrow airway anatomy. Mallampati is class II. Tongue protrudes centrally and palate elevates symmetrically. Tonsils are absent. Neck size is 14.25 inches. She has a small  overbite. Nasal inspection reveals no significant nasal mucosal bogginess or redness, and a mildly deviated septum to the L (s/p surgery in the 70s). She has a small nasal airway.   Chest: Clear to auscultation without wheezing, rhonchi or crackles noted.  Heart: S1+S2+0, regular and normal without murmurs, rubs or gallops noted.   Abdomen: Soft, non-tender and non-distended with normal bowel sounds appreciated on auscultation.  Extremities: There is no pitting edema in the distal lower extremities bilaterally. Pedal pulses are intact.  Skin: Warm and dry without trophic changes noted. There are no varicose veins.  Musculoskeletal: exam reveals no obvious joint deformities, tenderness or joint swelling or erythema.   Neurologically:  Mental status: The patient is awake, alert and oriented in all 4 spheres. Her immediate and remote memory, attention, language skills and fund of knowledge are appropriate. There is no evidence of aphasia, agnosia,  apraxia or anomia. Speech is clear with normal prosody and enunciation. Thought process is linear. Mood is normal and affect is normal.  Cranial nerves II - XII are as described above under HEENT exam. In addition: shoulder shrug is normal with equal shoulder height noted. Motor exam: Normal bulk, strength and tone is noted. There is no drift, tremor or rebound. Romberg is negative. Reflexes are 2+ throughout. Babinski: Toes are flexor bilaterally. Fine motor skills and coordination: intact with normal finger taps, normal hand movements, normal rapid alternating patting, normal foot taps and normal foot agility.  Cerebellar testing: No dysmetria or intention tremor on finger to nose testing. Heel to shin is unremarkable bilaterally. There is no truncal or gait ataxia.  Sensory exam: intact to light touch, pinprick, vibration, temperature sense and proprioception in the upper and lower extremities.  Gait, station and balance: She stands easily. No veering to one side is noted. No leaning to one side is noted. Posture is age-appropriate and stance is narrow based. Gait shows normal stride length and normal pace. No problems turning are noted. She turns en bloc. Tandem walk is unremarkable. Intact toe and heel stance is noted.               Assessment and Plan:   In summary, Jennifer Rodgers is a very pleasant 58 y.o.-year old female with a history and physical exam concerning for obstructive sleep apnea (OSA). I had a long chat with the patient about my findings and the diagnosis, its prognosis and treatment options. We talked about medical treatments and non-pharmacological approaches. I explained in particular the risks and ramifications of untreated moderate to severe OSA, especially with respect to developing cardiovascular disease down the Road, including congestive heart failure, difficult to treat hypertension, cardiac arrhythmias, or stroke. Even type 2 diabetes has in part been linked to untreated OSA.  We talked about trying to maintain a healthy lifestyle in general, as well as the importance of weight control. I encouraged the patient to eat healthy, exercise daily and keep well hydrated, to keep a scheduled bedtime and wake time routine, to not skip any meals and eat healthy snacks in between meals.  I recommended the following at this time: sleep study, diagnostic only. She indicates that she is very claustrophobic and that she does not wish to try CPAP if at all possible.  I explained the sleep test procedure to the patient and also outlined possible surgical and non-surgical treatment options of OSA, including the use of a custom-made dental device, upper airway surgical options, such as pillar implants, radiofrequency surgery, tongue base surgery,  and UPPP. I also explained the CPAP treatment option to the patient, who indicated that she would not be willing to try CPAP at this point unless it is critical. I think she may be a reasonable candidate for dental appliance unless sleep apnea is severe in her case. At that point I would ask her to try CPAP first. I also explained explained the importance of being compliant with PAP treatment, not only for insurance purposes but primarily to improve Her symptoms, and for the patient's long term health benefit, including to reduce Her cardiovascular risks. I answered all her questions today and the patient was in agreement. I would like to see her back after the sleep study is completed and encouraged her to call with any interim questions, concerns, problems or updates.   Thank you very much for allowing me to participate in the care of this nice patient. If I can be of any further assistance to you please do not hesitate to call me at [phone].  Sincerely,   Star Age, MD, PhD

## 2013-03-22 ENCOUNTER — Ambulatory Visit (INDEPENDENT_AMBULATORY_CARE_PROVIDER_SITE_OTHER): Payer: 59

## 2013-03-22 DIAGNOSIS — G4733 Obstructive sleep apnea (adult) (pediatric): Secondary | ICD-10-CM

## 2013-03-22 DIAGNOSIS — R0683 Snoring: Secondary | ICD-10-CM

## 2013-03-22 DIAGNOSIS — E669 Obesity, unspecified: Secondary | ICD-10-CM

## 2013-03-22 DIAGNOSIS — G479 Sleep disorder, unspecified: Secondary | ICD-10-CM

## 2013-03-22 DIAGNOSIS — R519 Headache, unspecified: Secondary | ICD-10-CM

## 2013-03-22 DIAGNOSIS — R51 Headache: Secondary | ICD-10-CM

## 2013-03-22 DIAGNOSIS — R400 Somnolence: Secondary | ICD-10-CM

## 2013-04-01 ENCOUNTER — Telehealth: Payer: Self-pay | Admitting: Neurology

## 2013-04-01 NOTE — Telephone Encounter (Signed)
Please call and notify the patient that the recent sleep study did confirm the diagnosis of obstructive sleep apnea and that I would like to discuss findings and treatment options with her during a FU visit. Please arrange appt.  Star Age, MD, PhD [facility])

## 2013-04-04 ENCOUNTER — Encounter: Payer: Self-pay | Admitting: *Deleted

## 2013-04-04 NOTE — Telephone Encounter (Signed)
I called and left a message for the patient about her recent sleep study results. I informed the patient that the study confirmed the diagnosis of obstructive sleep apnea and that Dr. Rexene Alberts would like to discuss the finding and treatment options with her. Informed the patient to callback to the office to schedule a follow up appointment with Dr. Rexene Alberts. I will fax a copy of the results to Dr. Danna Hefty office and mail the results to the patient.

## 2013-04-12 ENCOUNTER — Encounter: Payer: Self-pay | Admitting: Neurology

## 2013-04-12 ENCOUNTER — Encounter (INDEPENDENT_AMBULATORY_CARE_PROVIDER_SITE_OTHER): Payer: Self-pay

## 2013-04-12 ENCOUNTER — Ambulatory Visit (INDEPENDENT_AMBULATORY_CARE_PROVIDER_SITE_OTHER): Payer: 59 | Admitting: Neurology

## 2013-04-12 VITALS — BP 125/86 | HR 92 | Temp 98.1°F | Ht 64.75 in | Wt 180.0 lb

## 2013-04-12 DIAGNOSIS — E669 Obesity, unspecified: Secondary | ICD-10-CM

## 2013-04-12 DIAGNOSIS — G4733 Obstructive sleep apnea (adult) (pediatric): Secondary | ICD-10-CM

## 2013-04-12 DIAGNOSIS — M79609 Pain in unspecified limb: Secondary | ICD-10-CM

## 2013-04-12 DIAGNOSIS — M79672 Pain in left foot: Secondary | ICD-10-CM

## 2013-04-12 NOTE — Patient Instructions (Signed)
Call me back if you need a referral to a dentist, specializing in oral appliances for treatment of OSA. I think with weight loss, staying off your back for sleep and with trying a dental device, you will do well. We can always consider CPAP in the future if needed. You have overall mild sleep apnea.

## 2013-04-12 NOTE — Progress Notes (Signed)
Subjective:    Patient ID: Jennifer Rodgers is a 58 y.o. female.  HPI    Interim history:   Jennifer Rodgers is a very pleasant 58 year old right-handed woman with an underlying medical history of venous insufficiency, allergic rhinitis, chronic constipation, reflux disease with hiatal hernia, lumbar degenerative disc disease (surgery in 2005), hypothyroidism, migraines, and obesity, who presents for followup consultation of her obstructive sleep apnea, after a recent sleep study. The patient is unaccompanied today. I first met her on 03/03/2012 at the request of her primary care physician and she reported snoring, difficulty with sleep maintenance, and daytime somnolence. I suggested she have a sleep study. She had a baseline sleep study on 03/22/2013 and went over her test results with her in detail today. Sleep efficiency was reduced at 73.9% with a latency to sleep of 36 minutes and the wake after sleep onset of 71.5 minutes with mild to moderate sleep fragmentation noted. The arousal index was elevated, primarily because of respiratory events. She had a normal percentage of stage I sleep, an increased percentage of stage II sleep, and decreased percentage of deep sleep, and a near normal percentage of REM sleep with a high normal REM latency. She had no significant periodic leg movements or cardiac arrhythmias on EKG. She had moderate snoring. Total AHI was 8.1 per hour, rising to 31 0.1 per hour in REM sleep. Baseline oxygen saturation was 92%, nadir was 85%. Time below 88% saturation was 11 minutes and 36 seconds.  Today, she reports that she is not nearly as sleepy since she had a change in her thyroid medicine dose about a month ago from 100 mcg to 112 mcg. She follows up with Dr. Chalmers Cater in 5/15. She is working on weight loss and she has a Physiological scientist 2 times a week. She has less HAs. She sees a Restaurant manager, fast food. She has a dental appointment in 4/15 and she is not sure if her dentist makes oral appliances  for OSA. Overall, she feels her sleep is better d/t a combination of medication and lifestyle changes. She has lost 3 lb. She would be willing to try an oral appliance. She has no history of depression.   Her Past Medical History Is Significant For: Past Medical History  Diagnosis Date  . Other malaise and fatigue   . Screening for depression   . Dysuria   . Nausea alone   . Urinary frequency   . Abdominal pain, unspecified site   . Acute pharyngitis   . Acute upper respiratory infections of unspecified site   . Unspecified venous (peripheral) insufficiency   . Allergic rhinitis, cause unspecified   . Other constipation   . Diaphragmatic hernia without mention of obstruction or gangrene   . Esophageal reflux   . Degeneration of cervical intervertebral disc   . Unspecified hypothyroidism   . Migraine without aura, without mention of intractable migraine without mention of status migrainosus    Her Past Surgical History Is Significant For: Past Surgical History  Procedure Laterality Date  . Colonoscopy  03/03/2011  . Lumbar laminectomy  2005  . Thyroid surgery  2006    resection of L nodule  . Shoulder arthroscopy Left 2008  . Salpingoophorectomy  2004   Her Family History Is Significant For: Family History  Problem Relation Age of Onset  . Hypertension Mother   . Thyroid disease Mother   . Hypertension Sister   . Hyperlipidemia Sister   . Hyperlipidemia Mother   . Diabetes Sister   .  Hypertension Daughter   . Hyperlipidemia Daughter    Her Social History Is Significant For: History   Social History  . Marital Status: Married    Spouse Name: N/A    Number of Children: N/A  . Years of Education: N/A   Social History Main Topics  . Smoking status: Never Smoker   . Smokeless tobacco: None  . Alcohol Use: No  . Drug Use: No  . Sexual Activity: None   Other Topics Concern  . None   Social History Narrative  . None   Her Allergies Are:  Allergies  Allergen  Reactions  . Prednisone   :  Her Current Medications Are:  Outpatient Encounter Prescriptions as of 04/12/2013  Medication Sig  . Ascorbic Acid (VITAMIN C PO) Take 1 tablet by mouth daily.  Marland Kitchen ELESTRIN 0.52 MG/0.87 GM (0.06%) GEL Apply 1 application topically daily.  Marland Kitchen levothyroxine (SYNTHROID, LEVOTHROID) 112 MCG tablet Take 112 mcg by mouth daily before breakfast.  . Omega-3 Fatty Acids (FISH OIL CONCENTRATE PO) Take 2 capsules by mouth daily.  Marland Kitchen VITAMIN D, CHOLECALCIFEROL, PO Take 1 tablet by mouth daily.  . [DISCONTINUED] levothyroxine (SYNTHROID, LEVOTHROID) 100 MCG tablet Take 100 mcg by mouth daily before breakfast.  : Review of Systems:  Out of a complete 14 point review of systems, all are reviewed and negative with the exception of these symptoms as listed below:  Review of Systems  Constitutional: Negative.   HENT: Positive for tinnitus.   Eyes: Negative.   Respiratory: Negative.   Cardiovascular: Negative.   Gastrointestinal: Negative.   Endocrine: Negative.   Genitourinary: Negative.   Musculoskeletal: Negative.   Skin: Negative.   Allergic/Immunologic: Negative.   Neurological: Negative.   Hematological: Negative.   Psychiatric/Behavioral: Positive for sleep disturbance (snoring).    Objective:  Neurologic Exam  Physical Exam Physical Examination:   Filed Vitals:   04/12/13 1037  BP: 125/86  Pulse: 92  Temp: 98.1 F (36.7 C)   General Examination: The patient is a very pleasant 58 y.o. female in no acute distress. She appears well-developed and well-nourished and very well groomed.   HEENT: Normocephalic, atraumatic, pupils are equal, round and reactive to light and accommodation. Funduscopic exam is normal with sharp disc margins noted. Extraocular tracking is good without limitation to gaze excursion or nystagmus noted. Normal smooth pursuit is noted. Hearing is grossly intact. Face is symmetric with normal facial animation and normal facial sensation.  Speech is clear with no dysarthria noted. There is no hypophonia. There is no lip, neck/head, jaw or voice tremor. Neck is supple with full range of passive and active motion. There are no carotid bruits on auscultation. Oropharynx exam reveals: no mouth dryness, adequate dental hygiene and mild airway crowding, due to narrow airway anatomy. Mallampati is class II. Tongue protrudes centrally and palate elevates symmetrically. Tonsils are absent. Neck size is 14.25 inches. She has a small overbite. She has a small nasal airway.   Chest: Clear to auscultation without wheezing, rhonchi or crackles noted.  Heart: S1+S2+0, regular and normal without murmurs, rubs or gallops noted.   Abdomen: Soft, non-tender and non-distended with normal bowel sounds appreciated on auscultation.  Extremities: There is no pitting edema in the distal lower extremities bilaterally. Pedal pulses are intact. She has a non-pitting swelling in the anterior aspect of her L ankle, tender to palpation and worse with walking.   Skin: Warm and dry without trophic changes noted. There are no varicose veins.  Musculoskeletal: exam  reveals no obvious joint deformities, tenderness or joint swelling or erythema.   Neurologically:  Mental status: The patient is awake, alert and oriented in all 4 spheres. Her immediate and remote memory, attention, language skills and fund of knowledge are appropriate. There is no evidence of aphasia, agnosia, apraxia or anomia. Speech is clear with normal prosody and enunciation. Thought process is linear. Mood is normal and affect is normal.  Cranial nerves II - XII are as described above under HEENT exam. In addition: shoulder shrug is normal with equal shoulder height noted. Motor exam: Normal bulk, strength and tone is noted. There is no drift, tremor or rebound. Romberg is negative. Reflexes are 2+ throughout. Babinski: Toes are flexor bilaterally. Fine motor skills and coordination: intact with  normal finger taps, normal hand movements, normal rapid alternating patting, normal foot taps and normal foot agility.  Cerebellar testing: No dysmetria or intention tremor on finger to nose testing. Heel to shin is unremarkable bilaterally. There is no truncal or gait ataxia.  Sensory exam: intact to light touch, pinprick, vibration, temperature sense in the upper and lower extremities.  Gait, station and balance: She stands easily. No veering to one side is noted. No leaning to one side is noted. Posture is age-appropriate and stance is narrow based. Gait shows normal stride length and normal pace. No problems turning are noted. She turns en bloc. Tandem walk is unremarkable. Intact toe and heel stance is noted.               Assessment and Plan:   In summary, Jennifer Rodgers is a very pleasant 58 y.o.-year old female with an underlying medical history of venous insufficiency, allergic rhinitis, chronic constipation, reflux disease with hiatal hernia, lumbar degenerative disc disease (surgery in 2005), hypothyroidism, migraines, and obesity, who recently had a sleep study which showed evidence of overall mild OSA, worse a little bit when she sleeps on her back and more pronounced than previously. I talked her about her sleep test results in detail today. I think we have several treatment options here. She is very left and to pursue CPAP treatment and I do not think we need to push her CPAP and her case. I would like for her to pursue weight loss and stay off her back while sleeping and I think she may be a good candidate for a oral appliance. She does have an appointment with her own dentist next month on 05/11/2013 and will also call his office to find out if he provides custom-made dental appliances for treatment of OSA. If not I would be happy to refer her to one of the local dentists here specializing in dental appliances. I encouraged her to pursue weight loss and she has actually lost a few pounds. She  is watching what she eats. She has started drinking a protein milkshake for meal replacements. She also works with a Physiological scientist twice a week and runs about 4 times a week. I think overall she will do well with a combination of weight management, lifestyle changes, positional therapy as well as a dental appliance. At this juncture, I can see her back on an as-needed basis. She may have a tendon irritation in her left foot. She is asked to use some ice to reduce swelling. If this gets worse she may need to see her PCP. She actually reports that the swelling and pain has improved but was worst 2 days ago. She will call me back and report if she  needs a referral to a dentist.

## 2013-09-06 ENCOUNTER — Other Ambulatory Visit: Payer: Self-pay | Admitting: Gynecology

## 2013-09-06 ENCOUNTER — Other Ambulatory Visit: Payer: Self-pay

## 2013-09-06 DIAGNOSIS — N6320 Unspecified lump in the left breast, unspecified quadrant: Secondary | ICD-10-CM

## 2013-09-06 DIAGNOSIS — Z1231 Encounter for screening mammogram for malignant neoplasm of breast: Secondary | ICD-10-CM

## 2013-09-13 ENCOUNTER — Ambulatory Visit
Admission: RE | Admit: 2013-09-13 | Discharge: 2013-09-13 | Disposition: A | Discharge status: Home / Self Care | Payer: 59 | Source: Ambulatory Visit | Attending: Gynecology | Admitting: Gynecology

## 2013-09-13 DIAGNOSIS — N6320 Unspecified lump in the left breast, unspecified quadrant: Secondary | ICD-10-CM

## 2014-09-11 ENCOUNTER — Other Ambulatory Visit: Payer: Self-pay

## 2014-09-11 DIAGNOSIS — Z1231 Encounter for screening mammogram for malignant neoplasm of breast: Secondary | ICD-10-CM

## 2014-09-18 ENCOUNTER — Ambulatory Visit
Admission: RE | Admit: 2014-09-18 | Discharge: 2014-09-18 | Disposition: A | Discharge status: Home / Self Care | Payer: 59 | Source: Ambulatory Visit

## 2014-09-18 DIAGNOSIS — Z1231 Encounter for screening mammogram for malignant neoplasm of breast: Secondary | ICD-10-CM

## 2014-12-13 ENCOUNTER — Other Ambulatory Visit: Payer: Self-pay | Admitting: Endocrinology

## 2014-12-13 DIAGNOSIS — R1310 Dysphagia, unspecified: Secondary | ICD-10-CM

## 2014-12-20 ENCOUNTER — Ambulatory Visit
Admission: RE | Admit: 2014-12-20 | Discharge: 2014-12-20 | Disposition: A | Discharge status: Home / Self Care | Payer: 59 | Source: Ambulatory Visit | Attending: Endocrinology | Admitting: Endocrinology

## 2014-12-20 DIAGNOSIS — R1310 Dysphagia, unspecified: Secondary | ICD-10-CM

## 2015-10-16 ENCOUNTER — Other Ambulatory Visit: Payer: Self-pay | Admitting: Gynecology

## 2015-10-16 DIAGNOSIS — R928 Other abnormal and inconclusive findings on diagnostic imaging of breast: Secondary | ICD-10-CM

## 2015-10-22 ENCOUNTER — Other Ambulatory Visit: Payer: Self-pay | Admitting: Gynecology

## 2015-10-22 ENCOUNTER — Ambulatory Visit
Admission: RE | Admit: 2015-10-22 | Discharge: 2015-10-22 | Disposition: A | Discharge status: Home / Self Care | Payer: 59 | Source: Ambulatory Visit | Attending: Gynecology | Admitting: Gynecology

## 2015-10-22 DIAGNOSIS — R928 Other abnormal and inconclusive findings on diagnostic imaging of breast: Secondary | ICD-10-CM

## 2015-10-22 DIAGNOSIS — N6489 Other specified disorders of breast: Secondary | ICD-10-CM

## 2015-10-23 ENCOUNTER — Other Ambulatory Visit: Payer: Self-pay | Admitting: Gynecology

## 2015-10-23 DIAGNOSIS — R928 Other abnormal and inconclusive findings on diagnostic imaging of breast: Secondary | ICD-10-CM

## 2015-10-23 DIAGNOSIS — N6489 Other specified disorders of breast: Secondary | ICD-10-CM

## 2015-10-24 ENCOUNTER — Ambulatory Visit
Admission: RE | Admit: 2015-10-24 | Discharge: 2015-10-24 | Disposition: A | Discharge status: Home / Self Care | Payer: 59 | Source: Ambulatory Visit | Attending: Gynecology | Admitting: Gynecology

## 2015-10-24 DIAGNOSIS — R928 Other abnormal and inconclusive findings on diagnostic imaging of breast: Secondary | ICD-10-CM

## 2015-10-24 DIAGNOSIS — N6489 Other specified disorders of breast: Secondary | ICD-10-CM

## 2015-10-24 DIAGNOSIS — C50919 Malignant neoplasm of unspecified site of unspecified female breast: Secondary | ICD-10-CM

## 2015-10-24 HISTORY — DX: Malignant neoplasm of unspecified site of unspecified female breast: C50.919

## 2015-10-24 HISTORY — PX: BREAST BIOPSY: SHX20

## 2015-10-31 ENCOUNTER — Ambulatory Visit: Payer: Self-pay | Admitting: Surgery

## 2015-10-31 DIAGNOSIS — D0512 Intraductal carcinoma in situ of left breast: Secondary | ICD-10-CM

## 2015-10-31 DIAGNOSIS — D0501 Lobular carcinoma in situ of right breast: Secondary | ICD-10-CM

## 2015-10-31 NOTE — H&P (Signed)
istory of Present Illness Jennifer Rodgers [name] MD; 10/31/2015 5:19 PM) Patient words: Patient presents after core biopsy at the [facility] for abnormal mammogram. She has very dense breast tissue and underwent a mammogram this year which showed bilateral areas of vague density in the central breast. 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 Patient's mother had breast cancer. Patient denies any pain, nipple discharge or other problem prior to her mammogram.                              CLINICAL DATA: Patient was called back from screening mammogram for possible distortion in both breast.  EXAM: 2D DIGITAL DIAGNOSTIC BILATERAL MAMMOGRAM WITH CAD AND ADJUNCT TOMO  COMPARISON: Previous exam(s).  ACR Breast Density Category c: The breast tissue is heterogeneously dense, which may obscure small masses.  FINDINGS: Additional imaging of both breast were obtained. There is persistent distortion the 12 o'clock region of the right breast and subareolar region of the left breast. There are no malignant type microcalcifications.  Mammographic images were processed with CAD.  IMPRESSION: Bilateral breast distortion.  RECOMMENDATION: Bilateral affirm stereotactic biopsies recommended. The biopsies will be scheduled at the patient's convenience.  I have discussed the findings and recommendations with the patient. Results were also provided in writing at the conclusion of the visit. If applicable, a reminder letter will be sent to the patient regarding the next appointment.  BI-RADS CATEGORY 4: Suspicious.   Electronically Signed By: Jennifer Rodgers M.D. On: 10/22/2015 09:14      Diagnosis 1. 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 - LOBULAR NEOPLASIA (ATYPICAL LOBULAR HYPERPLASIA) - SEE  COMMENT. 2. Breast, right, needle core biopsy, 12 o'clock - LOBULAR NEOPLASIA (LOBULAR CARCINOMA IN SITU) - SEE COMMENT.  The patient is a 60 year old female.   Other Problems Jennifer Rodgers, CMA; 10/31/2015 3:53 PM) Gastroesophageal Reflux Disease General anesthesia - complications Thyroid Disease  Past Surgical History Jennifer Rodgers, CMA; 10/31/2015 3:53 PM) Breast Biopsy Bilateral. Hysterectomy (not due to cancer) - Complete Shoulder Surgery Left. Spinal Surgery - Lower Back Thyroid Surgery  Diagnostic Studies History Jennifer Rodgers, [place]; 10/31/2015 3:53 PM) Colonoscopy 1-5 years ago Mammogram within last year  Allergies Jennifer Rodgers, CMA; 10/31/2015 3:53 PM) PredniSONE *CORTICOSTEROIDS*  Medication History Jennifer Rodgers, CMA; 10/31/2015 3:55 PM) Synthroid (100MCG Tablet, Oral) Active. Pantoprazole Sodium (40MG  Tablet DR, Oral) Active. Estradiol (0.025MG /24HR Patch TW, Transdermal two patches a week) Active. Fish Oil (1200MG  Capsule, Oral two times daily) Active. Vitamin D3 (1000UNIT Tablet, Oral) Active. Vitamin B12 (1000MCG Tablet ER, Oral two times daily) Active. Vitamin C (250MG  Tablet, Oral two times daily) Active. Medications Reconciled  Social History Jennifer Rodgers, [place]; 10/31/2015 3:53 PM) Alcohol use Occasional alcohol use. Caffeine use Coffee, Tea. No drug use Tobacco use Never smoker.  Family History Jennifer Rodgers, [place]; 10/31/2015 3:53 PM) Breast Cancer Mother. Colon Polyps Mother. Hypertension Mother. Seizure disorder Daughter.  Pregnancy / Birth History Jennifer Rodgers, CMA; 10/31/2015 3:53 PM) Age at menarche 37 years. Gravida 1 Maternal age 61-20 Para 1     Review of Systems Jennifer Rodgers CMA; 10/31/2015 3:53 PM) General Present- Fatigue and Weight Gain. Not Present- Appetite Loss, Chills, Fever, Night Sweats and Weight Loss. Skin Not Present- Change in Wart/Mole, Dryness, Hives, Jaundice, New Lesions, Non-Healing  Wounds,  Rash and Ulcer. HEENT Present- Ringing in the Ears and Seasonal Allergies. Not Present- Earache, Hearing Loss, Hoarseness, Nose Bleed, Oral Ulcers, Sinus Pain, Sore Throat, Visual Disturbances, Wears glasses/contact lenses and Yellow Eyes. Respiratory Present- Snoring. Not Present- Bloody sputum, Chronic Cough, Difficulty Breathing and Wheezing. Breast Present- Breast Mass. Not Present- Breast Pain, Nipple Discharge and Skin Changes. Cardiovascular Present- Swelling of Extremities. Not Present- Chest Pain, Difficulty Breathing Lying Down, Leg Cramps, Palpitations, Rapid Heart Rate and Shortness of Breath. Gastrointestinal Not Present- Abdominal Pain, Bloating, Bloody Stool, Change in Bowel Habits, Chronic diarrhea, Constipation, Difficulty Swallowing, Excessive gas, Gets full quickly at meals, Hemorrhoids, Indigestion, Nausea, Rectal Pain and Vomiting. Female Genitourinary Not Present- Frequency, Nocturia, Painful Urination, Pelvic Pain and Urgency. Musculoskeletal Present- Swelling of Extremities. Not Present- Back Pain, Joint Pain, Joint Stiffness, Muscle Pain and Muscle Weakness. Neurological Not Present- Decreased Memory, Fainting, Headaches, Numbness, Seizures, Tingling, Tremor, Trouble walking and Weakness. Psychiatric Not Present- Anxiety, Bipolar, Change in Sleep Pattern, Depression, Fearful and Frequent crying. Endocrine Not Present- Cold Intolerance, Excessive Hunger, Hair Changes, Heat Intolerance, Hot flashes and New Diabetes. Hematology Not Present- Blood Thinners, Easy Bruising, Excessive bleeding, Gland problems, HIV and Persistent Infections.  Vitals Jennifer Rodgers CMA; 10/31/2015 3:56 PM) 10/31/2015 3:55 PM Weight: 190 lb Height: 65in Body Surface Area: 1.94 m Body Mass Index: 31.62 kg/m  Temp.: 98.41F(Temporal)  Pulse: 117 (Regular)  BP: 124/78 (Sitting, Left Arm, Standard)      Physical Exam (Mana Morison A. Candis Kabel MD; 10/31/2015 5:19 PM)  General Mental  Status-Alert. General Appearance-Consistent with stated age. Hydration-Well hydrated. Voice-Normal.  Head and Neck Head-normocephalic, atraumatic with no lesions or palpable masses. Trachea-midline. Thyroid Gland Characteristics - normal size and consistency.  Chest and Lung Exam Chest and lung exam reveals -quiet, even and easy respiratory effort with no use of accessory muscles and on auscultation, normal breath sounds, no adventitious sounds and normal vocal resonance. Inspection Chest Wall - Normal. Back - normal.  Breast Note: Bilateral breasts are quite dense. Bruising noted in the upper outer quadrant of both breasts. No masses or nipple discharge noted bilaterally.  Cardiovascular Cardiovascular examination reveals -normal heart sounds, regular rate and rhythm with no murmurs and normal pedal pulses bilaterally.  Neurologic Neurologic evaluation reveals -alert and oriented x 3 with no impairment of recent or remote memory. Mental Status-Normal.  Musculoskeletal Normal Exam - Left-Upper Extremity Strength Normal and Lower Extremity Strength Normal. Normal Exam - Right-Upper Extremity Strength Normal and Lower Extremity Strength Normal.  Lymphatic Head & Neck  General Head & Neck Lymphatics: Bilateral - Description - Normal. Axillary  General Axillary Region: Bilateral - Description - Normal. Tenderness - Non Tender.    Assessment & Plan (Jennifer Kiener A. Krisi Azua MD; 10/31/2015 5:20 PM)  BREAST NEOPLASM, TIS (LCIS), RIGHT (D05.01) Impression: Recommendation lumpectomy Risk of lumpectomy include bleeding, infection, seroma, more surgery, use of seed/wire, wound care, cosmetic deformity and the need for other treatments, death , blood clots, death. Pt agrees to proceed.  BREAST NEOPLASM, TIS (DCIS), LEFT (D05.12) Impression: Discussed bilateral lumpectomy with her. Patient will require MRI due to breast density and the finding of LCIS. This may  alter her treatment plan which I discussed with her today. If not, I recommended bilateral lumpectomies. Risks, benefits and alternative therapies discussed. I also discussed the possibility of bilateral mastectomy in the setting of LCIS since this increases her lifetime risk of breast cancer considerably. She has no interest in doing that and may qualify for yearly MRI given her dense  breast tissue in diagnosis of LCIS. Risk of lumpectomy include bleeding, infection, seroma, more surgery, use of seed/wire, wound care, cosmetic deformity and the need for other treatments, death , blood clots, death. Pt agrees to proceed.  Current Plans You are being scheduled for surgery - Our schedulers will call you.  You should hear from our office's scheduling department within 5 working days about the location, date, and time of surgery. We try to make accommodations for patient's preferences in scheduling surgery, but sometimes the OR schedule or the surgeon's schedule prevents [place] from making those accommodations.  If you have not heard from our office [phone]) in 5 working days, call the office and ask for your surgeon's nurse.  If you have other questions about your diagnosis, plan, or surgery, call the office and ask for your surgeon's nurse.  Pt Education - [facility] Breast Cancer Information Given - [facility] "Breast Journey" Package Pt Education - Pamphlet Given - Breast Biopsy: discussed with patient and provided information. We discussed the staging and pathophysiology of breast cancer. We discussed all of the different options for treatment for breast cancer including surgery, chemotherapy, radiation therapy, Herceptin, and antiestrogen therapy. We discussed a sentinel lymph node biopsy as she does not appear to having lymph node involvement right now. We discussed the performance of that with injection of radioactive tracer and blue dye. We discussed that she would have an incision underneath her axillary  hairline. We discussed that there is a bout a 10-20% chance of having a positive node with a sentinel lymph node biopsy and we will await the permanent pathology to make any other first further decisions in terms of her treatment. One of these options might be to return to the operating room to perform an axillary lymph node dissection. We discussed about a 1-2% risk lifetime of chronic shoulder pain as well as lymphedema associated with a sentinel lymph node biopsy. We discussed the options for treatment of the breast cancer which included lumpectomy versus a mastectomy. We discussed the performance of the lumpectomy with a wire placement. We discussed a 10-20% chance of a positive margin requiring reexcision in the operating room. We also discussed that she may need radiation therapy or antiestrogen therapy or both if she undergoes lumpectomy. We discussed the mastectomy and the postoperative care for that as well. We discussed that there is no difference in her survival whether she undergoes lumpectomy with radiation therapy or antiestrogen therapy versus a mastectomy. There is a slight difference in the local recurrence rate being 3-5% with lumpectomy and about 1% with a mastectomy. We discussed the risks of operation including bleeding, infection, possible reoperation. She understands her further therapy will be based on what her stages at the time of her operation.  Pt Education - [facility] breast cancer surgery: discussed with patient and provided information. Pt Education - [facility] Breast Biopsy HCI: discussed with patient and provided information. Pt Education - ABC (After Breast Cancer) Class Info: discussed with patient and provided information.

## 2015-11-01 ENCOUNTER — Telehealth: Payer: Self-pay | Admitting: Oncology

## 2015-11-01 ENCOUNTER — Encounter: Payer: Self-pay | Admitting: Oncology

## 2015-11-01 NOTE — Telephone Encounter (Signed)
Appt scheduled w/Magrinat on 10/18@430pm . Demographics verified. Pt aware to arrive 30 minutes early. Letter mailed to the patient.

## 2015-11-02 ENCOUNTER — Other Ambulatory Visit: Payer: Self-pay | Admitting: Surgery

## 2015-11-02 ENCOUNTER — Encounter: Payer: Self-pay | Admitting: Radiation Oncology

## 2015-11-02 DIAGNOSIS — D0512 Intraductal carcinoma in situ of left breast: Secondary | ICD-10-CM

## 2015-11-02 NOTE — Progress Notes (Signed)
Location of Breast Cancer: Right  Breast 12 o'clock position, Left Breast Subareolar  Histology per Pathology Report:   Diagnosis 10/24/2014:Dr. Erroll Luna, MD 1. 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 2. Breast, right, needle core biopsy, 12 o'clock - LOBULAR NEOPLASIA (LOBULAR CARCINOMA IN SITU)  Receptor Status: ER(100%+), PR (100%+), Her2-neu (   ), Ki-(   )  Did patient present with symptoms (if so, please note symptoms) or was this found on screening mammography?: Routine screening  Past/Anticipated interventions by surgeon, if any: to be scheduled with Dr. Cornett,.MD, waiting for MRI results which will be scheduled this week.  Past/Anticipated interventions by medical oncology, if any: Chemotherapy :Dr. Jana Hakim appt 11/07/15 _0   Lymphedema issues, if any: no  Pain issues, if any:  no  SAFETY ISSUES:  Prior radiation? No  Pacemaker/ICD? NO  Possible current pregnancy? NO  Is the patient on methotrexate? NO    Current Complaints / other details:  Menarche age 68, G3P1,  Mother Breast Cancer,HTN,Colon Polyps,Daughter seizure disorder Allergies: Prednisone  Patient is here with her husband.  BP (!) 135/97 (BP Location: Right Arm, Patient Position: Sitting)   Pulse 93   Temp 98.6 F (37 C) (Oral)   Ht _1  (1.651 m)   Wt 190 lb 12.8 oz (86.5 kg)   SpO2 96%   BMI 31.75 kg/m    Wt Readings from Last 3 Encounters:  11/05/15 190 lb 12.8 oz (86.5 kg)  04/12/13 180 lb (81.6 kg)  03/03/13 183 lb (83 kg)

## 2015-11-05 ENCOUNTER — Encounter: Payer: Self-pay | Admitting: Radiation Oncology

## 2015-11-05 ENCOUNTER — Ambulatory Visit
Admission: RE | Admit: 2015-11-05 | Discharge: 2015-11-05 | Disposition: A | Discharge status: Home / Self Care | Payer: 59 | Source: Ambulatory Visit | Attending: Radiation Oncology | Admitting: Radiation Oncology

## 2015-11-05 DIAGNOSIS — I872 Venous insufficiency (chronic) (peripheral): Secondary | ICD-10-CM | POA: Insufficient documentation

## 2015-11-05 DIAGNOSIS — K219 Gastro-esophageal reflux disease without esophagitis: Secondary | ICD-10-CM | POA: Insufficient documentation

## 2015-11-05 DIAGNOSIS — Z809 Family history of malignant neoplasm, unspecified: Secondary | ICD-10-CM | POA: Diagnosis not present

## 2015-11-05 DIAGNOSIS — C50112 Malignant neoplasm of central portion of left female breast: Secondary | ICD-10-CM

## 2015-11-05 DIAGNOSIS — Z170 Estrogen receptor positive status [ER+]: Secondary | ICD-10-CM | POA: Diagnosis not present

## 2015-11-05 DIAGNOSIS — Z833 Family history of diabetes mellitus: Secondary | ICD-10-CM | POA: Insufficient documentation

## 2015-11-05 DIAGNOSIS — D0512 Intraductal carcinoma in situ of left breast: Secondary | ICD-10-CM | POA: Insufficient documentation

## 2015-11-05 DIAGNOSIS — D0501 Lobular carcinoma in situ of right breast: Secondary | ICD-10-CM | POA: Diagnosis not present

## 2015-11-05 DIAGNOSIS — Z888 Allergy status to other drugs, medicaments and biological substances status: Secondary | ICD-10-CM | POA: Diagnosis not present

## 2015-11-05 DIAGNOSIS — Z8349 Family history of other endocrine, nutritional and metabolic diseases: Secondary | ICD-10-CM | POA: Insufficient documentation

## 2015-11-05 DIAGNOSIS — E039 Hypothyroidism, unspecified: Secondary | ICD-10-CM | POA: Diagnosis not present

## 2015-11-05 DIAGNOSIS — Z79899 Other long term (current) drug therapy: Secondary | ICD-10-CM | POA: Insufficient documentation

## 2015-11-05 DIAGNOSIS — Z8249 Family history of ischemic heart disease and other diseases of the circulatory system: Secondary | ICD-10-CM | POA: Insufficient documentation

## 2015-11-05 HISTORY — DX: Malignant neoplasm of unspecified site of unspecified female breast: C50.919

## 2015-11-05 NOTE — Progress Notes (Signed)
Please see the Nurse Progress Note in the MD Initial Consult Encounter for this patient. 

## 2015-11-05 NOTE — Addendum Note (Signed)
Encounter addended by: Jacqulyn Liner, RN on: 11/05/2015  2:51 PM<BR>    Actions taken: Charge Capture section accepted

## 2015-11-05 NOTE — Progress Notes (Signed)
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 female who had a screening mammogram in late September 2017 that detected possible distortions in both breasts.  Bilateral diagnostic mammogram on 10/22/15 showed a persistent distortion the 12 o'clock region of the right breast and subareolar region of the left breast. It did not show any malignant type microcalcifications.  Biopsies were performed on 10/24/15 and then the patient was seen by Dr. Brantley Stage. Subareolar needle core biopsy of the left breast revealed low grade DCIS involving a complex sclerosing lesion and lobular neoplasia (ER 100% positive, PR 100% positive). 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  The patient presents today with her husband to discuss the role of radiation in the management of her disease.  PREVIOUS RADIATION THERAPY: No  PAST MEDICAL HISTORY:  has a past medical history of Abdominal pain, unspecified site; Acute pharyngitis; Acute upper respiratory infections of unspecified site; Allergic rhinitis, cause unspecified; Breast cancer ([place]) (10/24/2015); Degeneration of cervical intervertebral disc; Diaphragmatic hernia without mention of obstruction or gangrene; Dysuria; Esophageal reflux; Migraine without aura, without mention of intractable migraine without mention of status migrainosus; Nausea alone; Other constipation; Other malaise and fatigue; Screening for depression; Unspecified hypothyroidism; Unspecified  venous (peripheral) insufficiency; and Urinary frequency.    PAST SURGICAL HISTORY: Past Surgical History:  Procedure Laterality Date  . COLONOSCOPY  03/03/2011  . LUMBAR LAMINECTOMY  2005  . SALPINGOOPHORECTOMY  2004  . SHOULDER ARTHROSCOPY Left 2008  . THYROID SURGERY  2006   resection of L nodule    FAMILY HISTORY: family history includes Cancer in her mother; Diabetes in her sister; Hyperlipidemia in her daughter, mother, and sister; Hypertension in her daughter, mother, and sister; Thyroid disease in her mother.  SOCIAL HISTORY:  reports that she has never smoked. She has never used smokeless tobacco. She reports that she does not drink alcohol or use drugs.  ALLERGIES: Prednisone  MEDICATIONS:  Current Outpatient Prescriptions  Medication Sig Dispense Refill  . levothyroxine (SYNTHROID, LEVOTHROID) 112 MCG tablet Take 100 mcg by mouth daily before breakfast.     . Omega-3 Fatty Acids (FISH OIL CONCENTRATE PO) Take 2 capsules by mouth daily.    . pantoprazole (PROTONIX) 40 MG tablet Take 40 mg by mouth daily.    . vitamin B-12 (CYANOCOBALAMIN) 1000 MCG tablet Take 1,000 mcg by mouth 2 (two) times daily.    . vitamin C (ASCORBIC ACID) 250 MG tablet Take 250 mg by mouth 2 (two) times daily.    Marland Kitchen VITAMIN D, CHOLECALCIFEROL, PO Take 1,000 Units by mouth daily.     Marland Kitchen ELESTRIN 0.52 MG/0.87 GM (0.06%) GEL Apply 1 application topically daily.     No current facility-administered medications for this encounter.     REVIEW OF SYSTEMS:  A 15 point review of systems is documented in the electronic medical record. This was obtained by the nursing staff. However, I reviewed this with the patient to discuss relevant findings and make appropriate changes.  Pertinent items  noted in HPI and remainder of comprehensive ROS otherwise negative.   PHYSICAL EXAM:  height is 5\' 5"  (1.651 m) and weight is 190 lb 12.8 oz (86.5 kg). Her oral temperature is 98.6 F (37 C). Her blood pressure is 135/97  (abnormal) and her pulse is 93. Her oxygen saturation is 96%.   General: Alert and oriented, in no acute distress HEENT: Head is normocephalic. Extraocular movements are intact. Oropharynx is clear. Neck: Neck is supple, no palpable cervical or supraclavicular lymphadenopathy. Heart: Regular in rate and rhythm with no murmurs, rubs, or gallops. Chest: Clear to auscultation bilaterally, with no rhonchi, wheezes, or rales. Abdomen: Soft, nontender, nondistended, with no rigidity or guarding. Extremities: No cyanosis or edema. Lymphatics: see Neck Exam Skin: No concerning lesions. Musculoskeletal: symmetric strength and muscle tone throughout. Neurologic: Cranial nerves II through XII are grossly intact. No obvious focalities. Speech is fluent. Coordination is intact. Psychiatric: Judgment and insight are intact. Affect is appropriate. Breast: Left breast has some bruising in the UIQ adjacent to the areolar border some thickening in this area consistent with a hematoma, no palpable mass, no nipple bleeding/discharge. Right breast has some bruising in the 11:00 position adjacent to the areolar border, no dominant mass in the breast, no nipple discharge or bleeding.  ECOG = 0  LABORATORY DATA:  Lab Results  Component Value Date   HGB 12.4 08/04/2006   HCT 35.6 (L) 08/04/2006   No results found for: NA, K, CL, CO2, GLUCOSE, CREATININE, CALCIUM    RADIOGRAPHY: Mm Diag Breast Tomo Bilateral  Result Date: 10/24/2015 CLINICAL DATA:  Status post bilateral stereotactic core needle biopsy for left subareolar and right 12 o'clock areas of architectural distortion. EXAM: DIAGNOSTIC BILATERAL MAMMOGRAM POST STEREOTACTIC BIOPSY COMPARISON:  Previous exam(s). FINDINGS: Mammographic images were obtained following stereotactic guided biopsy of the bilateral breasts. Two-view mammography demonstrates presence of coil shaped marker within the left subareolar breast, an X shaped marker within the right 12  o'clock breast, in appropriate radiographic position. IMPRESSION: Successful placement of tissue markers status post bilateral stereotactic core needle biopsy. Final Assessment: Post Procedure Mammograms for Marker Placement Electronically Signed   By: Fidela Salisbury M.D.   On: 10/24/2015 13:44   Mm Diag Breast Tomo Bilateral  Result Date: 10/22/2015 CLINICAL DATA:  Patient was called back from screening mammogram for possible distortion in both breast. EXAM: 2D DIGITAL DIAGNOSTIC BILATERAL MAMMOGRAM WITH CAD AND ADJUNCT TOMO COMPARISON:  Previous exam(s). ACR Breast Density Category c: The breast tissue is heterogeneously dense, which may obscure small masses. FINDINGS: Additional imaging of both breast were obtained. There is persistent distortion the 12 o'clock region of the right breast and subareolar region of the left breast. There are no malignant type microcalcifications. Mammographic images were processed with CAD. IMPRESSION: Bilateral breast distortion. RECOMMENDATION: Bilateral affirm stereotactic biopsies recommended. The biopsies will be scheduled at the patient's convenience. I have discussed the findings and recommendations with the patient. Results were also provided in writing at the conclusion of the visit. If applicable, a reminder letter will be sent to the patient regarding the next appointment. BI-RADS CATEGORY  4: Suspicious. Electronically Signed   By: Lillia Mountain M.D.   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 The  right breast shows lobular carcinoma in situ. This was found to be concordant by Dr. Fidela Salisbury. Pathology results were discussed with the patient by telephone. The patient reported doing well after the  biopsies with tenderness at the sites. Post biopsy instructions and care were reviewed and questions were answered. The patient was encouraged to call [facility] for any additional concerns. Surgical consultation has been arranged with Dr. Erroll Luna at [facility] on October 31, 2015. Pathology results reported by Susa Raring RN, BSN on 10/26/2015. Electronically Signed   By: Fidela Salisbury M.D.   On: 10/26/2015 09:57   Addendum Date: 10/24/2015   ADDENDUM REPORT: 10/24/2015 13:45 ADDENDUM: Please note that coil shaped tissue marker was deployed in the left subareolar breast, and X shaped tissue marker was deployed in the right 12 o'clock breast. Electronically Signed   By: Fidela Salisbury M.D.   On: 10/24/2015 13:45   Result Date: 10/26/2015 CLINICAL DATA:  Areas of architectural distortion in bilateral breasts, left subareolar breast and right 12 o'clock breast. EXAM: BILATERAL BREAST STEREOTACTIC CORE NEEDLE BIOPSY COMPARISON:  Previous exams. FINDINGS: The patient and I discussed the procedure of stereotactic-guided biopsy including benefits and alternatives. We discussed the high likelihood of a successful procedure. We discussed the risks of the procedure including infection, bleeding, tissue injury, clip migration, and inadequate sampling. Informed written consent was given. The usual time out protocol was performed immediately prior to the procedure. Using sterile technique and 1% Lidocaine as local anesthetic, under stereotactic guidance, a 9 gauge vacuum assisted device was used to perform core needle biopsy of left subareolar breast architectural distortion using a superior approach. Next, using sterile technique and 1% Lidocaine as local anesthetic, under stereotactic guidance, a 9 gauge vacuum assisted device was used to perform core needle biopsy of right 12 o'clock breast using a lateral approach. At the conclusion of the  procedure, a coil shaped tissue marker clip was deployed into the biopsy cavity bilaterally. Follow-up 2-view mammogram was performed and dictated separately. IMPRESSION: Stereotactic-guided biopsy of left subareolar breast and right 12 o'clock areas of architectural distortion. No apparent complications. Electronically Signed: By: Fidela Salisbury M.D. 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 The right breast shows lobular carcinoma in situ. This was found to be concordant by Dr. Fidela Salisbury. Pathology results were discussed with the patient by telephone. The patient reported doing well after the biopsies with tenderness at the sites. Post biopsy instructions and care were reviewed and questions were answered. The patient was encouraged to call [facility] for any additional concerns. Surgical consultation has been arranged with Dr. Erroll Luna at [facility] on October 31, 2015. Pathology results reported by Susa Raring RN, BSN on 10/26/2015. Electronically Signed   By: Fidela Salisbury M.D.   On: 10/26/2015 09:57   Addendum Date: 10/24/2015   ADDENDUM REPORT: 10/24/2015 13:45 ADDENDUM: Please note that coil shaped tissue marker was deployed in the left subareolar breast, and X shaped tissue marker was deployed in the right 12 o'clock breast. Electronically Signed   By: Fidela Salisbury M.D.   On: 10/24/2015 13:45   Result Date: 10/26/2015 CLINICAL DATA:  Areas  of architectural distortion in bilateral breasts, left subareolar breast and right 12 o'clock breast. EXAM: BILATERAL BREAST STEREOTACTIC CORE NEEDLE BIOPSY COMPARISON:  Previous exams. FINDINGS: The patient and I  discussed the procedure of stereotactic-guided biopsy including benefits and alternatives. We discussed the high likelihood of a successful procedure. We discussed the risks of the procedure including infection, bleeding, tissue injury, clip migration, and inadequate sampling. Informed written consent was given. The usual time out protocol was performed immediately prior to the procedure. Using sterile technique and 1% Lidocaine as local anesthetic, under stereotactic guidance, a 9 gauge vacuum assisted device was used to perform core needle biopsy of left subareolar breast architectural distortion using a superior approach. Next, using sterile technique and 1% Lidocaine as local anesthetic, under stereotactic guidance, a 9 gauge vacuum assisted device was used to perform core needle biopsy of right 12 o'clock breast using a lateral approach. At the conclusion of the procedure, a coil shaped tissue marker clip was deployed into the biopsy cavity bilaterally. Follow-up 2-view mammogram was performed and dictated separately. IMPRESSION: Stereotactic-guided biopsy of left subareolar breast and right 12 o'clock areas of architectural distortion. No apparent complications. Electronically Signed: By: Fidela Salisbury M.D. On: 10/24/2015 13:42      IMPRESSION: Low grade DCIS of the left breast (ER/PR +) and LCIS of the right breast  The patient was diagnosed with DCIS of the left breast and LCIS of the right breast. She has very dense breasts and will be scheduled for a bilateral breast MRI per Dr. Josetta Huddle recommendations. The patient will call to get this scheduled in the near future. We briefly discussed the COMET trial, but the patient may not be a good candidate for this study given her very dense breasts. We will have more information available after her MRI.  PLAN: The patient is scheduled to consult with Dr. Jana Hakim on 11/07/15 for further input. She will be scheduled for a bilateral breast MRI in the  near future and undergo re-evaluation by general surgery. Re-evaluation with radiation oncology after her anticipated surgery.     ------------------------------------------------  Blair Promise, PhD, MD  This document serves as a record of services personally performed by Gery Pray, MD. It was created on his behalf by Darcus Austin, a trained medical scribe. The creation of this record is based on the scribe's personal observations and the provider's statements to them. This document has been checked and approved by the attending provider.

## 2015-11-06 ENCOUNTER — Other Ambulatory Visit: Payer: Self-pay | Admitting: *Deleted

## 2015-11-06 DIAGNOSIS — C50112 Malignant neoplasm of central portion of left female breast: Secondary | ICD-10-CM

## 2015-11-07 ENCOUNTER — Other Ambulatory Visit: Payer: Self-pay | Admitting: Surgery

## 2015-11-07 ENCOUNTER — Ambulatory Visit (HOSPITAL_BASED_OUTPATIENT_CLINIC_OR_DEPARTMENT_OTHER): Payer: 59 | Admitting: Oncology

## 2015-11-07 ENCOUNTER — Other Ambulatory Visit (HOSPITAL_BASED_OUTPATIENT_CLINIC_OR_DEPARTMENT_OTHER): Payer: 59

## 2015-11-07 DIAGNOSIS — D0501 Lobular carcinoma in situ of right breast: Secondary | ICD-10-CM

## 2015-11-07 DIAGNOSIS — Z803 Family history of malignant neoplasm of breast: Secondary | ICD-10-CM

## 2015-11-07 DIAGNOSIS — C50112 Malignant neoplasm of central portion of left female breast: Secondary | ICD-10-CM

## 2015-11-07 DIAGNOSIS — D0512 Intraductal carcinoma in situ of left breast: Secondary | ICD-10-CM | POA: Diagnosis not present

## 2015-11-07 LAB — COMPREHENSIVE METABOLIC PANEL
ALT: 13 U/L (ref 0–55)
ANION GAP: 9 meq/L (ref 3–11)
AST: 14 U/L (ref 5–34)
Albumin: 3.9 g/dL (ref 3.5–5.0)
Alkaline Phosphatase: 93 U/L (ref 40–150)
BUN: 14.9 mg/dL (ref 7.0–26.0)
CHLORIDE: 106 meq/L (ref 98–109)
CO2: 27 meq/L (ref 22–29)
Calcium: 10 mg/dL (ref 8.4–10.4)
Creatinine: 1 mg/dL (ref 0.6–1.1)
EGFR: 64 mL/min/{1.73_m2} — AB (ref >90)
Glucose: 114 mg/dl (ref 70–140)
Potassium: 4.3 mEq/L (ref 3.5–5.1)
Sodium: 142 mEq/L (ref 136–145)
Total Bilirubin: 0.26 mg/dL (ref 0.20–1.20)
Total Protein: 7.4 g/dL (ref 6.4–8.3)

## 2015-11-07 LAB — CBC WITH DIFFERENTIAL/PLATELET
BASO%: 0.2 % (ref 0.0–2.0)
Basophils Absolute: 0 10*3/uL (ref 0.0–0.1)
EOS ABS: 0.1 10*3/uL (ref 0.0–0.5)
EOS%: 1.3 % (ref 0.0–7.0)
HEMATOCRIT: 36.8 % (ref 34.8–46.6)
HGB: 12.6 g/dL (ref 11.6–15.9)
LYMPH%: 39.5 % (ref 14.0–49.7)
MCH: 29.2 pg (ref 25.1–34.0)
MCHC: 34.2 g/dL (ref 31.5–36.0)
MCV: 85.4 fL (ref 79.5–101.0)
MONO#: 0.4 10*3/uL (ref 0.1–0.9)
MONO%: 6.6 % (ref 0.0–14.0)
NEUT%: 52.4 % (ref 38.4–76.8)
NEUTROS ABS: 3.2 10*3/uL (ref 1.5–6.5)
NRBC: 0 % (ref 0–0)
Platelets: 272 10*3/uL (ref 145–400)
RBC: 4.31 10*6/uL (ref 3.70–5.45)
RDW: 12.7 % (ref 11.2–14.5)
WBC: 6.1 10*3/uL (ref 3.9–10.3)
lymph#: 2.4 10*3/uL (ref 0.9–3.3)

## 2015-11-07 LAB — DRAW EXTRA CLOT TUBE

## 2015-11-07 NOTE — Progress Notes (Addendum)
[place]  [phone] [phone]     ID: Jennifer Rodgers DOB: Jan 19, 1956  MR#: [id number]  [id number]  Patient Care Team: Jennifer Solian, MD as PCP - General (Internal Medicine) Jennifer Cruel, MD as Consulting Physician (Oncology) Jennifer Luna, MD as Consulting Physician (General Surgery) Jennifer Campbell, MD as Consulting Physician (Gastroenterology) Jennifer Cruel, MD OTHER MD:  CHIEF COMPLAINT: Ductal carcinoma in situ  CURRENT TREATMENT: Awaiting definitive surgery vs. COMET trial   BREAST CANCER HISTORY: Jennifer Rodgers had routine screening mammography in September 2017 suggesting possible areas of distortion in both breasts. On 10/22/2015 she underwent bilateral diagnostic mammography with tomography at the [facility]. The Breast density was category C.  In the right breast there was an area of persistent distortion at the 12:00 region. There was also an area of distortion in the subareolar region of the left breast.  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  The patient's subsequent history is as detailed below   INTERVAL HISTORY: Jennifer Rodgers was evaluated in the breast clinic 11/07/2015 accompanied by her husband Jennifer Rodgers. Her case was also presented in the multidisciplinary breast cancer conference 11/07/2015. At that time a preliminary plan was proposed: Consider COMET trial, bilateral breast conserving surgery, consider genetics only if there are additional relatives with cancer, consider yearly MRI for screening given bilateral LCIS and breast density category C.  REVIEW OF SYSTEMS: There were no specific symptoms leading to the original mammogram, which was routinely scheduled. The patient denies unusual headaches, visual  changes, nausea, vomiting, stiff neck, dizziness, or gait imbalance. There has been no cough, phlegm production, or pleurisy, no chest pain or pressure, and no change in bowel or bladder habits. The patient denies fever, rash, bleeding, unexplained fatigue or unexplained weight loss. A detailed review of systems was otherwise entirely negative.   PAST MEDICAL HISTORY: Past Medical History:  Diagnosis Date  . Abdominal pain, unspecified site   . Acute pharyngitis   . Acute upper respiratory infections of unspecified site   . Allergic rhinitis, cause unspecified   . Breast cancer ([place]) 10/24/2015   Left breast   . Degeneration of cervical intervertebral disc   . Diaphragmatic hernia without mention of obstruction or gangrene   . Dysuria   . Esophageal reflux   . Migraine without aura, without mention of intractable migraine without mention of status migrainosus   . Nausea alone   . Other constipation   . Other malaise and fatigue   . Screening for depression   . Unspecified hypothyroidism   . Unspecified venous (peripheral) insufficiency   . Urinary frequency     PAST SURGICAL HISTORY: Past Surgical History:  Procedure Laterality Date  . COLONOSCOPY  03/03/2011  . LUMBAR LAMINECTOMY  2005  . SALPINGOOPHORECTOMY  2004  . SHOULDER ARTHROSCOPY Left 2008  . THYROID SURGERY  2006   resection of L nodule    FAMILY HISTORY Family History  Problem Relation Age of Onset  . Hypertension Mother   . Thyroid disease Mother   . Hyperlipidemia Mother   . Cancer Mother     breast  . Hypertension Sister   . Hyperlipidemia Sister   . Diabetes Sister   . Hypertension Daughter   . Hyperlipidemia Daughter   The patient has no information regarding her father or his  Rodgers of the family. The patient's mother is alive at age 28. She developed breast cancer in her early 38s. She has also been found to have multiple polyps. She has not had genetics testing. The patient has no brothers. She has 3  half-sisters. There is no history of breast or ovarian cancer in the family otherwise than as noted  GYNECOLOGIC HISTORY:  No LMP recorded. Patient has had a hysterectomy. Menarche age 38, first live birth age 14, the patient is GX P1. She underwent TAH-BSO in 2004. She has been on hormone replacement with estrogen until 2017.  SOCIAL HISTORY:  Jennifer Rodgers works in Programmer, applications. Jennifer Rodgers is retired. He used to work as a Paediatric nurse. Daughter Jennifer Rodgers lives in [place] where she works in Insurance underwriter out of her home. The patient has 2 grandchildren and 2 great-grandchildren. She is not a Ambulance person.    ADVANCED DIRECTIVES: Not in place   HEALTH MAINTENANCE: Social History  Substance Use Topics  . Smoking status: Never Smoker  . Smokeless tobacco: Never Used  . Alcohol use No     Colonoscopy: March 2014/[facility]  PAP: Status post hysterectomy  Bone density: Never   Allergies  Allergen Reactions  . Prednisone Other (See Comments)    Extreme facial flushing and confusion    Current Outpatient Prescriptions  Medication Sig Dispense Refill  . Omega-3 Fatty Acids (FISH OIL CONCENTRATE PO) Take 2 capsules by mouth daily.    . pantoprazole (PROTONIX) 40 MG tablet Take 40 mg by mouth daily.    Marland Kitchen SYNTHROID 100 MCG tablet Take 1 tablet by mouth daily.    . vitamin B-12 (CYANOCOBALAMIN) 1000 MCG tablet Take 1,000 mcg by mouth 2 (two) times daily.    . vitamin C (ASCORBIC ACID) 250 MG tablet Take 250 mg by mouth 2 (two) times daily.    Marland Kitchen VITAMIN D, CHOLECALCIFEROL, PO Take 1,000 Units by mouth daily.      No current facility-administered medications for this visit.     OBJECTIVE: Middle-aged white woman who appears well Vitals:   11/07/15 1637  BP: (!) 141/83  Pulse: 80  Resp: 18  Temp: 98 F (36.7 C)     Body mass index is 32.15 kg/m.    ECOG FS:0 - Asymptomatic  Ocular: Sclerae unicteric, pupils equal, round and reactive to  light Ear-nose-throat: Oropharynx clear and moist Lymphatic: No cervical or supraclavicular adenopathy Lungs no rales or rhonchi, good excursion bilaterally Heart regular rate and rhythm, no murmur appreciated Abd soft, nontender, positive bowel sounds MSK no focal spinal tenderness, no joint edema Neuro: non-focal, well-oriented, appropriate affect Breasts: Status post bilateral biopsies. I do not palpate a mass in either breast. There are no skin or nipple changes of concern. Both axillae are benign.   LAB RESULTS:  CMP     Component Value Date/Time   NA 142 11/07/2015 1607   K 4.3 11/07/2015 1607   CO2 27 11/07/2015 1607   GLUCOSE 114 11/07/2015 1607   BUN 14.9 11/07/2015 1607   CREATININE 1.0 11/07/2015 1607   CALCIUM 10.0 11/07/2015 1607   PROT 7.4 11/07/2015 1607   ALBUMIN 3.9 11/07/2015 1607   AST 14 11/07/2015 1607   ALT 13 11/07/2015 1607   ALKPHOS 93 11/07/2015 1607   BILITOT 0.26 11/07/2015 1607    INo results found for: SPEP, UPEP  Lab Results  Component Value Date   WBC 6.1 11/07/2015   NEUTROABS 3.2 11/07/2015   HGB 12.6 11/07/2015  HCT 36.8 11/07/2015   MCV 85.4 11/07/2015   PLT 272 11/07/2015      Chemistry      Component Value Date/Time   NA 142 11/07/2015 1607   K 4.3 11/07/2015 1607   CO2 27 11/07/2015 1607   BUN 14.9 11/07/2015 1607   CREATININE 1.0 11/07/2015 1607      Component Value Date/Time   CALCIUM 10.0 11/07/2015 1607   ALKPHOS 93 11/07/2015 1607   AST 14 11/07/2015 1607   ALT 13 11/07/2015 1607   BILITOT 0.26 11/07/2015 1607       No results found for: LABCA2  No components found for: LABCA125  No results for input(s): INR in the last 168 hours.  Urinalysis No results found for: COLORURINE, APPEARANCEUR, LABSPEC, PHURINE, GLUCOSEU, HGBUR, BILIRUBINUR, KETONESUR, PROTEINUR, UROBILINOGEN, NITRITE, LEUKOCYTESUR   STUDIES: Mm Diag Breast Tomo Bilateral  Result Date: 10/24/2015 CLINICAL DATA:  Status post bilateral  stereotactic core needle biopsy for left subareolar and right 12 o'clock areas of architectural distortion. EXAM: DIAGNOSTIC BILATERAL MAMMOGRAM POST STEREOTACTIC BIOPSY COMPARISON:  Previous exam(s). FINDINGS: Mammographic images were obtained following stereotactic guided biopsy of the bilateral breasts. Two-view mammography demonstrates presence of coil shaped marker within the left subareolar breast, an X shaped marker within the right 12 o'clock breast, in appropriate radiographic position. IMPRESSION: Successful placement of tissue markers status post bilateral stereotactic core needle biopsy. Final Assessment: Post Procedure Mammograms for Marker Placement Electronically Signed   By: Fidela Salisbury M.D.   On: 10/24/2015 13:44   Mm Diag Breast Tomo Bilateral  Result Date: 10/22/2015 CLINICAL DATA:  Patient was called back from screening mammogram for possible distortion in both breast. EXAM: 2D DIGITAL DIAGNOSTIC BILATERAL MAMMOGRAM WITH CAD AND ADJUNCT TOMO COMPARISON:  Previous exam(s). ACR Breast Density Category c: The breast tissue is heterogeneously dense, which may obscure small masses. FINDINGS: Additional imaging of both breast were obtained. There is persistent distortion the 12 o'clock region of the right breast and subareolar region of the left breast. There are no malignant type microcalcifications. Mammographic images were processed with CAD. IMPRESSION: Bilateral breast distortion. RECOMMENDATION: Bilateral affirm stereotactic biopsies recommended. The biopsies will be scheduled at the patient's convenience. I have discussed the findings and recommendations with the patient. Results were also provided in writing at the conclusion of the visit. If applicable, a reminder letter will be sent to the patient regarding the next appointment. BI-RADS CATEGORY  4: Suspicious. Electronically Signed   By: Lillia Mountain M.D.   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 The right breast shows lobular carcinoma in situ. This was found to be concordant by Dr. Fidela Salisbury. Pathology results were discussed with the patient by telephone. The patient reported doing well after the biopsies with tenderness at the sites. Post biopsy instructions and care were reviewed and questions were answered. The patient was encouraged to call [facility] for any additional concerns. Surgical consultation has been arranged with Dr. Erroll Rodgers at [facility] on October 31, 2015. Pathology results reported by Susa Raring RN, BSN on 10/26/2015. Electronically Signed   By: Fidela Salisbury M.D.   On: 10/26/2015 09:57   Addendum Date: 10/24/2015   ADDENDUM  REPORT: 10/24/2015 13:45 ADDENDUM: Please note that coil shaped tissue marker was deployed in the left subareolar breast, and X shaped tissue marker was deployed in the right 12 o'clock breast. Electronically Signed   By: Fidela Salisbury M.D.   On: 10/24/2015 13:45   Result Date: 10/26/2015 CLINICAL DATA:  Areas of architectural distortion in bilateral breasts, left subareolar breast and right 12 o'clock breast. EXAM: BILATERAL BREAST STEREOTACTIC CORE NEEDLE BIOPSY COMPARISON:  Previous exams. FINDINGS: The patient and I discussed the procedure of stereotactic-guided biopsy including benefits and alternatives. We discussed the high likelihood of a successful procedure. We discussed the risks of the procedure including infection, bleeding, tissue injury, clip migration, and inadequate sampling. Informed written consent was given. The usual time out protocol was performed immediately prior to the procedure. Using sterile technique and 1% Lidocaine as  local anesthetic, under stereotactic guidance, a 9 gauge vacuum assisted device was used to perform core needle biopsy of left subareolar breast architectural distortion using a superior approach. Next, using sterile technique and 1% Lidocaine as local anesthetic, under stereotactic guidance, a 9 gauge vacuum assisted device was used to perform core needle biopsy of right 12 o'clock breast using a lateral approach. At the conclusion of the procedure, a coil shaped tissue marker clip was deployed into the biopsy cavity bilaterally. Follow-up 2-view mammogram was performed and dictated separately. IMPRESSION: Stereotactic-guided biopsy of left subareolar breast and right 12 o'clock areas of architectural distortion. No apparent complications. Electronically Signed: By: Fidela Salisbury M.D. 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 The right breast shows lobular carcinoma in situ. This was found to be concordant by Dr. Fidela Salisbury. Pathology results were discussed with the patient by telephone. The patient reported doing well after the biopsies with tenderness at the sites. Post biopsy instructions and care were reviewed and questions were answered. The patient was encouraged to call [facility] for any additional concerns. Surgical consultation has been arranged with Dr. Erroll Rodgers at [facility] on October 31, 2015. Pathology results reported by Susa Raring RN, BSN on 10/26/2015. Electronically Signed   By: Fidela Salisbury M.D.   On: 10/26/2015 09:57   Addendum Date: 10/24/2015   ADDENDUM REPORT: 10/24/2015 13:45 ADDENDUM: Please note that coil shaped tissue marker was deployed in the  left subareolar breast, and X shaped tissue marker was deployed in the right 12 o'clock breast. Electronically Signed   By: Fidela Salisbury M.D.   On: 10/24/2015 13:45   Result Date: 10/26/2015 CLINICAL DATA:  Areas of architectural distortion in bilateral breasts, left subareolar breast and right 12 o'clock breast. EXAM: BILATERAL BREAST STEREOTACTIC CORE NEEDLE BIOPSY COMPARISON:  Previous exams. FINDINGS: The patient and I discussed the procedure of stereotactic-guided biopsy including benefits and alternatives. We discussed the high likelihood of a successful procedure. We discussed the risks of the procedure including infection, bleeding, tissue injury, clip migration, and inadequate sampling. Informed written consent was given. The usual time out protocol was performed immediately prior to the procedure. Using sterile technique and 1% Lidocaine as local anesthetic, under stereotactic guidance, a 9 gauge vacuum assisted device was used to perform core needle biopsy of left subareolar breast architectural distortion using a  superior approach. Next, using sterile technique and 1% Lidocaine as local anesthetic, under stereotactic guidance, a 9 gauge vacuum assisted device was used to perform core needle biopsy of right 12 o'clock breast using a lateral approach. At the conclusion of the procedure, a coil shaped tissue marker clip was deployed into the biopsy cavity bilaterally. Follow-up 2-view mammogram was performed and dictated separately. IMPRESSION: Stereotactic-guided biopsy of left subareolar breast and right 12 o'clock areas of architectural distortion. No apparent complications. Electronically Signed: By: Fidela Salisbury M.D. On: 10/24/2015 13:42    ELIGIBLE FOR AVAILABLE RESEARCH PROTOCOL: COMET  ASSESSMENT: 60 y.o. status post left breast biopsy 10/24/2015 showing ductal carcinoma in situ, low-grade, estrogen and progesterone receptor positive, involving a complex sclerosing lesion, and  LCIS  (a) right sided LCIS also biopsied 10/24/2015  (1) the patient is considering the COMET trial versus standard of care   PLAN: We spent the better part of today's hour-long appointment discussing the biology of breast cancer in general, and the specifics of the patient's tumor in particular. Ellianne understands that in noninvasive ductal carcinoma, also called ductal carcinoma in situ ("DCIS") the breast cancer cells remain trapped in the ducts were they started. They cannot travel to a vital organ. For that reason these cancers in themselves are not life-threatening.  If the whole breast is removed then all the ducts are removed and since the cancer cells are trapped in the ducts, the cure rate with mastectomy for noninvasive breast cancer is approximately 99%. Nevertheless we recommend lumpectomy, because there is no survival advantage to mastectomy and because the cosmetic result is generally superior with breast conservation.  Since the patient is keeping her breasts, there will be some risk of recurrence. The recurrence can only be in the same breast since, again, the cells are trapped in the ducts. There is no connection from one breast to the other. The risk of local recurrence is cut by more than half with radiation, which is standard in this situation.  In estrogen receptor positive cancers like Jahniya's, anti-estrogens can also be considered. They will further reduce the risk of recurrence by one half. In addition anti-estrogens will lower the risk of a new breast cancer developing in either breast, also by one half. That risk approaches 1% per year. Anti-estrogens reduce it to 1/2%.  We also discussed the fact that lobular carcinoma in situ, or LCIS, is not so much a cancer as a marker of cancer risk. Unlike DCIS, if we end up with positive margins after excision of LCIS, that does not require further surgery.   We then proceeded to a discussion of the COMET trial. The patient  understands that even though this trial does randomize people between standard of care on observation, she retains absolute control over which treatment option she will actually receive. She wanted to know if the additional studies every 6 months would be paid for by the trial and she wanted to know if she opted for observation whether she could change her mind at a later time.  Ayeisha is attracted by the idea of observation with an antiestrogen. However she is scheduled for a breast MRI 11/14/2015. If that does not change the overall surgical plan, then she would like to meet with the research nurse the next day or so to consider whether she wants to participate in the study and then make a definitive decision for observation or 4 standard of care. This is being operationalized  Brelee has a good understanding  of the overall plan. She agrees with it. She knows the goal of treatment in her case is cure. She will call with any problems that may develop before her next visit here.  Jennifer Cruel, MD   11/08/2015 6:24 AM Medical Oncology and Hematology [facility] [address] [place], [place] [address] Tel. [phone]    Fax. [phone]

## 2015-11-08 ENCOUNTER — Ambulatory Visit
Admission: RE | Admit: 2015-11-08 | Discharge: 2015-11-08 | Disposition: A | Discharge status: Home / Self Care | Payer: 59 | Source: Ambulatory Visit | Attending: Surgery | Admitting: Surgery

## 2015-11-08 DIAGNOSIS — D0501 Lobular carcinoma in situ of right breast: Secondary | ICD-10-CM

## 2015-11-08 DIAGNOSIS — D0512 Intraductal carcinoma in situ of left breast: Secondary | ICD-10-CM

## 2015-11-09 ENCOUNTER — Telehealth: Payer: Self-pay | Admitting: *Deleted

## 2015-11-09 NOTE — Telephone Encounter (Signed)
Called pt to give navigation resources and contact information. Pt relate she is going to wait on breast MRI before she decides on her treatment plan. Encourage pt to call with needs or questions. Received verbal understanding.

## 2015-11-12 ENCOUNTER — Encounter: Payer: Self-pay | Admitting: Medical Oncology

## 2015-11-14 ENCOUNTER — Ambulatory Visit
Admission: RE | Admit: 2015-11-14 | Discharge: 2015-11-14 | Disposition: A | Discharge status: Home / Self Care | Payer: 59 | Source: Ambulatory Visit | Attending: Surgery | Admitting: Surgery

## 2015-11-14 DIAGNOSIS — D0512 Intraductal carcinoma in situ of left breast: Secondary | ICD-10-CM

## 2015-11-14 MED ORDER — GADOBENATE DIMEGLUMINE 529 MG/ML IV SOLN
18.0000 mL | Freq: Once | INTRAVENOUS | Status: AC | PRN
  Administered 2015-11-14: 18 mL via INTRAVENOUS

## 2015-11-19 ENCOUNTER — Ambulatory Visit (HOSPITAL_BASED_OUTPATIENT_CLINIC_OR_DEPARTMENT_OTHER): Payer: 59 | Admitting: Oncology

## 2015-11-19 VITALS — BP 135/74 | HR 91 | Temp 97.8°F | Resp 18 | Ht 65.0 in | Wt 193.0 lb

## 2015-11-19 DIAGNOSIS — D0512 Intraductal carcinoma in situ of left breast: Secondary | ICD-10-CM | POA: Diagnosis not present

## 2015-11-19 DIAGNOSIS — N63 Unspecified lump in unspecified breast: Secondary | ICD-10-CM | POA: Diagnosis not present

## 2015-11-19 DIAGNOSIS — Z170 Estrogen receptor positive status [ER+]: Secondary | ICD-10-CM

## 2015-11-19 MED ORDER — ESTROGENS, CONJUGATED 0.625 MG/GM VA CREA: 1.0000 | TOPICAL_CREAM | Freq: Every day | VAGINAL | 12 refills | Status: DC

## 2015-11-19 MED ORDER — TAMOXIFEN CITRATE 20 MG PO TABS: 20.0000 mg | ORAL_TABLET | Freq: Every day | ORAL | 12 refills | Status: AC

## 2015-11-19 NOTE — Progress Notes (Signed)
[place]  [phone] [phone]) [phone]     ID: [name] DOB: 1955/09/03  MR#: [id number]  [id number]  Patient Care Team: Jennifer Solian, MD as PCP - General (Internal Medicine) Jennifer Cruel, MD as Consulting Physician (Oncology) Jennifer Luna, MD as Consulting Physician (General Surgery) Jennifer Campbell, MD as Consulting Physician (Gastroenterology) Jennifer Cruel, MD OTHER MD:  CHIEF COMPLAINT: Ductal carcinoma in situ  CURRENT TREATMENT: Awaiting definitive surgery vs. COMET trial   BREAST CANCER HISTORY: From the original intake note:  Jennifer Rodgers had routine screening mammography in September 2017 suggesting possible areas of distortion in both breasts. On 10/22/2015 she underwent bilateral diagnostic mammography with tomography at the [address]. The Breast density was category C.  In the right breast there was an area of persistent distortion at the 12:00 region. There was also an area of distortion in the subareolar region of the left breast.  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  The patient's subsequent history is as detailed below   INTERVAL HISTORY: Jennifer Rodgers returns today for follow-up of her estrogen receptor positive breast cancer accompanied by her husband Jennifer Rodgers. Since her last visit here she had a breast MRI which showed 2 additional lesions in the right breast, suspicious for invasive disease. She is here to discuss how to move forward.  REVIEW OF SYSTEMS: She tolerated the MRI without difficulty. She is having some sinus symptoms, little bit of ankle swelling, and finds that she is needing to use readers. Otherwise a detailed review of systems today was noncontributory  PAST MEDICAL HISTORY: Past  Medical History:  Diagnosis Date  . Abdominal pain, unspecified site   . Acute pharyngitis   . Acute upper respiratory infections of unspecified site   . Allergic rhinitis, cause unspecified   . Breast cancer ([place]) 10/24/2015   Left breast   . Degeneration of cervical intervertebral disc   . Diaphragmatic hernia without mention of obstruction or gangrene   . Dysuria   . Esophageal reflux   . Migraine without aura, without mention of intractable migraine without mention of status migrainosus   . Nausea alone   . Other constipation   . Other malaise and fatigue   . Screening for depression   . Unspecified hypothyroidism   . Unspecified venous (peripheral) insufficiency   . Urinary frequency     PAST SURGICAL HISTORY: Past Surgical History:  Procedure Laterality Date  . COLONOSCOPY  03/03/2011  . LUMBAR LAMINECTOMY  2005  . SALPINGOOPHORECTOMY  2004  . SHOULDER ARTHROSCOPY Left 2008  . THYROID SURGERY  2006   resection of L nodule    FAMILY HISTORY Family History  Problem Relation Age of Onset  . Hypertension Mother   . Thyroid disease Mother   . Hyperlipidemia Mother   . Cancer Mother     breast  . Hypertension Sister   . Hyperlipidemia Sister   . Diabetes Sister   . Hypertension Daughter   . Hyperlipidemia Daughter   The patient has no information regarding her father or his Rodgers of the family. The patient's mother is alive at age 31. She developed breast cancer in her early 30s. She has also been found to have multiple polyps. She has not had genetics testing. The patient has no brothers. She has 3 half-sisters. There is  no history of breast or ovarian cancer in the family otherwise than as noted  GYNECOLOGIC HISTORY:  No LMP recorded. Patient has had a hysterectomy. Menarche age 14, first live birth age 55, the patient is GX P1. She underwent TAH-BSO in 2004. She has been on hormone replacement with estrogen until 2017.  SOCIAL HISTORY:  [name] works in Arts development officer. Jennifer Rodgers is retired. He used to work as a Paediatric nurse. Daughter Jennifer Rodgers lives in [place] where she works in Insurance underwriter out of her home. The patient has 2 grandchildren and 2 great-grandchildren. She is not a Ambulance person.    ADVANCED DIRECTIVES: Not in place   HEALTH MAINTENANCE: Social History  Substance Use Topics  . Smoking status: Never Smoker  . Smokeless tobacco: Never Used  . Alcohol use No     Colonoscopy: March 2014/[facility]  PAP: Status post hysterectomy  Bone density: Never   Allergies  Allergen Reactions  . Prednisone Other (See Comments)    Extreme facial flushing and confusion    Current Outpatient Prescriptions  Medication Sig Dispense Refill  . conjugated estrogens (PREMARIN) vaginal cream Place 1 Applicatorful vaginally daily. 42.5 g 12  . Omega-3 Fatty Acids (FISH OIL CONCENTRATE PO) Take 2 capsules by mouth daily.    . pantoprazole (PROTONIX) 40 MG tablet Take 40 mg by mouth daily.    Marland Kitchen SYNTHROID 100 MCG tablet Take 1 tablet by mouth daily.    . tamoxifen (NOLVADEX) 20 MG tablet Take 1 tablet (20 mg total) by mouth daily. 90 tablet 12  . vitamin B-12 (CYANOCOBALAMIN) 1000 MCG tablet Take 1,000 mcg by mouth 2 (two) times daily.    . vitamin C (ASCORBIC ACID) 250 MG tablet Take 250 mg by mouth 2 (two) times daily.    Marland Kitchen VITAMIN D, CHOLECALCIFEROL, PO Take 1,000 Units by mouth daily.      No current facility-administered medications for this visit.     OBJECTIVE: Middle-aged white woman In no acute distress Vitals:   11/19/15 1554  BP: 135/74  Pulse: 91  Resp: 18  Temp: 97.8 F (36.6 C)     Body mass index is 32.12 kg/m.    ECOG FS:0 - Asymptomatic  Sclerae unicteric, pupils round and equal Oropharynx clear and moist-- no thrush or other lesions No cervical or supraclavicular adenopathy Lungs no rales or rhonchi Heart regular rate and rhythm Abd soft, nontender, positive bowel sounds MSK no focal spinal  tenderness, no upper extremity lymphedema Neuro: nonfocal, well oriented, appropriate affect Breasts: Deferred  LAB RESULTS:  CMP     Component Value Date/Time   NA 142 11/07/2015 1607   K 4.3 11/07/2015 1607   CO2 27 11/07/2015 1607   GLUCOSE 114 11/07/2015 1607   BUN 14.9 11/07/2015 1607   CREATININE 1.0 11/07/2015 1607   CALCIUM 10.0 11/07/2015 1607   PROT 7.4 11/07/2015 1607   ALBUMIN 3.9 11/07/2015 1607   AST 14 11/07/2015 1607   ALT 13 11/07/2015 1607   ALKPHOS 93 11/07/2015 1607   BILITOT 0.26 11/07/2015 1607    INo results found for: SPEP, UPEP  Lab Results  Component Value Date   WBC 6.1 11/07/2015   NEUTROABS 3.2 11/07/2015   HGB 12.6 11/07/2015   HCT 36.8 11/07/2015   MCV 85.4 11/07/2015   PLT 272 11/07/2015      Chemistry      Component Value Date/Time   NA 142 11/07/2015 1607   K 4.3 11/07/2015 1607  CO2 27 11/07/2015 1607   BUN 14.9 11/07/2015 1607   CREATININE 1.0 11/07/2015 1607      Component Value Date/Time   CALCIUM 10.0 11/07/2015 1607   ALKPHOS 93 11/07/2015 1607   AST 14 11/07/2015 1607   ALT 13 11/07/2015 1607   BILITOT 0.26 11/07/2015 1607       No results found for: LABCA2  No components found for: LABCA125  No results for input(s): INR in the last 168 hours.  Urinalysis No results found for: COLORURINE, APPEARANCEUR, LABSPEC, PHURINE, GLUCOSEU, HGBUR, BILIRUBINUR, KETONESUR, PROTEINUR, UROBILINOGEN, NITRITE, LEUKOCYTESUR   STUDIES: Mr Breast Bilateral W Wo Contrast  Result Date: 11/15/2015 CLINICAL DATA:  Recent RIGHT breast stereotactic biopsy revealing lobular carcinoma in situ. 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 LABS:  Not applicable EXAM: BILATERAL BREAST MRI WITH AND WITHOUT CONTRAST TECHNIQUE: Multiplanar, multisequence MR images of both breasts were obtained prior to and following the intravenous administration of 18  ml of MultiHance. THREE-DIMENSIONAL MR IMAGE RENDERING ON INDEPENDENT WORKSTATION: Three-dimensional MR images were rendered by post-processing of the original MR data on an independent workstation. The three-dimensional MR images were interpreted, and findings are reported in the following complete MRI report for this study. Three dimensional images were evaluated at the independent DynaCad workstation COMPARISON:  Previous exam(s). FINDINGS: Breast composition: c. Heterogeneous fibroglandular tissue. Background parenchymal enhancement: Minimal Right breast: Biopsy site with associated circumferential enhancement within the upper right breast, 12 o'clock axis region, with associated biopsy clip, consistent with biopsy-proven LCIS (series 9, image 47 ; series 3, image 47). Approximately 1.5 cm medial and slightly superior to the recent right breast biopsy site, within the upper inner quadrant, 1 o'clock axis region, at anterior depth, is an additional enhancing spiculated mass which measures 1.1 x 0.9 x 1 cm, with persistent kinetics, highly suspicious for MULTIFOCAL DISEASE (series 9, images 45-48 ; series 3, image 46). (additional 6 mm enhancing satellite nodule located 8 mm anterior to the inferior aspect of this spiculated mass slightly increases the overall dimensions). There is an additional irregular enhancing mass within the upper inner quadrant of the right breast, 2 o'clock axis region, at middle depth, 1.5 cm inferior to the spiculated mass described above, measuring 1 x 0.6 x 0.7 cm, with persistent kinetics, suspicious for additional MULTIFOCAL/MULTICENTRIC DISEASE (series 9, image 66) (additional suspicious non mass enhancement at the lateral margin of this irregular enhancing mass increases the transverse dimension to 2.5 cm (series 9, image 65)). Incidental note made of scattered benign cysts within the right breast. Left breast: Biopsy tract with associated circumferential enhancement within the  subareolar left breast, with associated biopsy clip, consistent with biopsy-proven low-grade DCIS involving a complex sclerosing lesion and LCIS. No additional enhancing masses, abnormal non mass enhancement or other secondary signs of malignancy within the left breast. Incidental note made of scattered benign cysts within the left breast. Lymph nodes: No abnormal appearing lymph nodes. Ancillary findings:  None. IMPRESSION: 1. Suspicious spiculated mass within the upper inner quadrant of the right breast, 1 o'clock axis region, at anterior depth, approximately 1.5 cm medial to the recent biopsy site/biopsy clip, measuring 1.1 x 0.9 x 1 cm, highly suspicious for ADDITIONAL MULTIFOCAL DISEASE, POSSIBLY INVASIVE DISEASE. 2. Additional suspicious irregular mass within the upper inner quadrant of the right breast, 2 o'clock axis region, at middle depth, located 1.5 cm inferior to the spiculated mass described above, measuring 1 x 0.6 x  0.7 cm (but with adjacent suspicious non mass enhancement increasing the transverse dimension to 2.5 cm), also highly suspicious for additional multifocal/multicentric disease. 3. Biopsy site within the right breast at the 12 o'clock axis corresponding to the site of recently diagnosed LCIS. 4. Biopsy site within the subareolar left breast corresponding to the site of recently diagnosed low grade DCIS involving a complex sclerosing lesion and LCIS. No evidence of additional malignancy within the left breast. RECOMMENDATION: If breast conservation surgery is being considered, recommend MRI guided biopsies of the 2 suspicious masses described above (Impressions 1 and 2). BI-RADS CATEGORY  4: Suspicious. Electronically Signed   By: Franki Cabot M.D.   On: 11/15/2015 12:09   Mm Diag Breast Tomo Bilateral  Result Date: 10/24/2015 CLINICAL DATA:  Status post bilateral stereotactic core needle biopsy for left subareolar and right 12 o'clock areas of architectural distortion. EXAM:  DIAGNOSTIC BILATERAL MAMMOGRAM POST STEREOTACTIC BIOPSY COMPARISON:  Previous exam(s). FINDINGS: Mammographic images were obtained following stereotactic guided biopsy of the bilateral breasts. Two-view mammography demonstrates presence of coil shaped marker within the left subareolar breast, an X shaped marker within the right 12 o'clock breast, in appropriate radiographic position. IMPRESSION: Successful placement of tissue markers status post bilateral stereotactic core needle biopsy. Final Assessment: Post Procedure Mammograms for Marker Placement Electronically Signed   By: Fidela Salisbury M.D.   On: 10/24/2015 13:44   Mm Diag Breast Tomo Bilateral  Result Date: 10/22/2015 CLINICAL DATA:  Patient was called back from screening mammogram for possible distortion in both breast. EXAM: 2D DIGITAL DIAGNOSTIC BILATERAL MAMMOGRAM WITH CAD AND ADJUNCT TOMO COMPARISON:  Previous exam(s). ACR Breast Density Category c: The breast tissue is heterogeneously dense, which may obscure small masses. FINDINGS: Additional imaging of both breast were obtained. There is persistent distortion the 12 o'clock region of the right breast and subareolar region of the left breast. There are no malignant type microcalcifications. Mammographic images were processed with CAD. IMPRESSION: Bilateral breast distortion. RECOMMENDATION: Bilateral affirm stereotactic biopsies recommended. The biopsies will be scheduled at the patient's convenience. I have discussed the findings and recommendations with the patient. Results were also provided in writing at the conclusion of the visit. If applicable, a reminder letter will be sent to the patient regarding the next appointment. BI-RADS CATEGORY  4: Suspicious. Electronically Signed   By: Lillia Mountain M.D.   On: 10/22/2015 09:14   Mm Lt Breast Bx W Loc Dev 1st Lesion Image Bx Spec Stereo Guide  Addendum Date: 10/26/2015   ADDENDUM REPORT: 10/26/2015 09:57 ADDENDUM: Pathology revealed low  grade ductal carcinoma in situ involving a complex sclerosing lesion and lobular carcinoma in situ in the left breast. The right breast shows lobular carcinoma in situ. This was found to be concordant by Dr. Fidela Salisbury. Pathology results were discussed with the patient by telephone. The patient reported doing well after the biopsies with tenderness at the sites. Post biopsy instructions and care were reviewed and questions were answered. The patient was encouraged to call [facility] for any additional concerns. Surgical consultation has been arranged with Dr. Erroll Rodgers at [facility] on October 31, 2015. Pathology results reported by Susa Raring RN, BSN on 10/26/2015. Electronically Signed   By: Fidela Salisbury M.D.   On: 10/26/2015 09:57   Addendum Date: 10/24/2015   ADDENDUM REPORT: 10/24/2015 13:45 ADDENDUM: Please note that coil shaped tissue marker was deployed in the left subareolar breast, and X shaped tissue marker  was deployed in the right 12 o'clock breast. Electronically Signed   By: Fidela Salisbury M.D.   On: 10/24/2015 13:45   Result Date: 10/26/2015 CLINICAL DATA:  Areas of architectural distortion in bilateral breasts, left subareolar breast and right 12 o'clock breast. EXAM: BILATERAL BREAST STEREOTACTIC CORE NEEDLE BIOPSY COMPARISON:  Previous exams. FINDINGS: The patient and I discussed the procedure of stereotactic-guided biopsy including benefits and alternatives. We discussed the high likelihood of a successful procedure. We discussed the risks of the procedure including infection, bleeding, tissue injury, clip migration, and inadequate sampling. Informed written consent was given. The usual time out protocol was performed immediately prior to the procedure. Using sterile technique and 1% Lidocaine as local anesthetic, under stereotactic guidance, a 9 gauge vacuum assisted device was used to perform core needle biopsy  of left subareolar breast architectural distortion using a superior approach. Next, using sterile technique and 1% Lidocaine as local anesthetic, under stereotactic guidance, a 9 gauge vacuum assisted device was used to perform core needle biopsy of right 12 o'clock breast using a lateral approach. At the conclusion of the procedure, a coil shaped tissue marker clip was deployed into the biopsy cavity bilaterally. Follow-up 2-view mammogram was performed and dictated separately. IMPRESSION: Stereotactic-guided biopsy of left subareolar breast and right 12 o'clock areas of architectural distortion. No apparent complications. Electronically Signed: By: Fidela Salisbury M.D. 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 The right breast shows lobular carcinoma in situ. This was found to be concordant by Dr. Fidela Salisbury. Pathology results were discussed with the patient by telephone. The patient reported doing well after the biopsies with tenderness at the sites. Post biopsy instructions and care were reviewed and questions were answered. The patient was encouraged to call [facility] for any additional concerns. Surgical consultation has been arranged with Dr. Erroll [facility] at [facility] on October 31, 2015. Pathology results reported by Susa Raring RN, BSN on 10/26/2015. Electronically Signed   By: Fidela Salisbury M.D.   On: 10/26/2015 09:57   Addendum Date: 10/24/2015   ADDENDUM REPORT: 10/24/2015 13:45 ADDENDUM: Please note that coil shaped tissue marker was deployed in the left subareolar breast, and X shaped tissue marker was deployed in the right 12 o'clock breast. Electronically Signed    By: Fidela Salisbury M.D.   On: 10/24/2015 13:45   Result Date: 10/26/2015 CLINICAL DATA:  Areas of architectural distortion in bilateral breasts, left subareolar breast and right 12 o'clock breast. EXAM: BILATERAL BREAST STEREOTACTIC CORE NEEDLE BIOPSY COMPARISON:  Previous exams. FINDINGS: The patient and I discussed the procedure of stereotactic-guided biopsy including benefits and alternatives. We discussed the high likelihood of a successful procedure. We discussed the risks of the procedure including infection, bleeding, tissue injury, clip migration, and inadequate sampling. Informed written consent was given. The usual time out protocol was performed immediately prior to the procedure. Using sterile technique and 1% Lidocaine as local anesthetic, under stereotactic guidance, a 9 gauge vacuum assisted device was used to perform core needle biopsy of left subareolar breast architectural distortion using a superior approach. Next, using sterile technique and 1% Lidocaine as local anesthetic, under stereotactic guidance, a 9 gauge vacuum assisted device was used  to perform core needle biopsy of right 12 o'clock breast using a lateral approach. At the conclusion of the procedure, a coil shaped tissue marker clip was deployed into the biopsy cavity bilaterally. Follow-up 2-view mammogram was performed and dictated separately. IMPRESSION: Stereotactic-guided biopsy of left subareolar breast and right 12 o'clock areas of architectural distortion. No apparent complications. Electronically Signed: By: Fidela Salisbury M.D. On: 10/24/2015 13:42    ELIGIBLE FOR AVAILABLE RESEARCH PROTOCOL: COMET  ASSESSMENT: 60 y.o. status post left breast biopsy 10/24/2015 showing ductal carcinoma in situ, low-grade, estrogen and progesterone receptor positive, involving a complex sclerosing lesion, and LCIS  (a) right sided LCIS also biopsied 10/24/2015  (1) the patient is considering the COMET trial versus standard of  care  (2) breast MRI 11/15/2015 shows 2 additional masses in the right breast not seen by prior studies: Biopsy pending   PLAN: I spent a little over 30 minutes today with Velban and her husband going over her situation. Unless she proceeds to mastectomy directly, she needs MRI guided biopsies of the 2 additional lesions just found by MRI in her right breast. We do not recommend mass colectomy on the basis of an MRI because this test can be too sensitive and the 2 lesions noted on this MRI may both be benign, in which case we are right back to the decision whether or not to participate in the COMET trial.  If either of the masses is invasive however she will need at a minimum a double lumpectomy or possibly a mastectomy  Because all this may take some time to sort out, we discussed starting anti-estrogens now. She is still premenopausal, in fact menstruating regularly, so the only choice is tamoxifen. In that regard it is favorable that she is status post hysterectomy. We discussed the possible toxicities, Rodgers effects and complications of tamoxifen and she is very eager to start.  She is also interested in using estrogen vaginal cream, which in my opinion is safe while under cover of tamoxifen. She understands some estrogen will be absorbed transvaginally. I have asked her not to start the cream until she has been on tamoxifen at least 2 weeks to make sure she will be able to tolerated.  The plan then is to proceed to MRI guided biopsies of the 2 additional lesions and then the patient can meet with Dr. Brantley Stage to make an definitive decision regarding her surgical options. Accordingly I'm making her a return appointment with me in approximately a month. Of course we will contact her with the results of the biopsies assisted acidic, available  Railynn has a good understanding of the overall plan. She agrees with it. She knows the goal of treatment in her case is cure. She will call with any problems  that may develop before her next visit here.  Jennifer Cruel, MD   11/19/2015 5:57 PM Medical Oncology and Hematology [facility] [address] [place], [place] [address] Tel. [phone]    Fax. [phone]

## 2015-11-20 ENCOUNTER — Other Ambulatory Visit: Payer: Self-pay | Admitting: Oncology

## 2015-11-20 ENCOUNTER — Other Ambulatory Visit: Payer: Self-pay | Admitting: *Deleted

## 2015-11-20 DIAGNOSIS — R928 Other abnormal and inconclusive findings on diagnostic imaging of breast: Secondary | ICD-10-CM

## 2015-11-22 ENCOUNTER — Encounter (HOSPITAL_BASED_OUTPATIENT_CLINIC_OR_DEPARTMENT_OTHER): Payer: Self-pay

## 2015-11-22 ENCOUNTER — Ambulatory Visit (HOSPITAL_BASED_OUTPATIENT_CLINIC_OR_DEPARTMENT_OTHER): Admit: 2015-11-22 | Payer: 59 | Admitting: Surgery

## 2015-11-22 SURGERY — BREAST LUMPECTOMY WITH RADIOACTIVE SEED LOCALIZATION
Anesthesia: General | Site: Breast | Laterality: Bilateral

## 2015-11-23 ENCOUNTER — Ambulatory Visit
Admission: RE | Admit: 2015-11-23 | Discharge: 2015-11-23 | Disposition: A | Discharge status: Home / Self Care | Payer: 59 | Source: Ambulatory Visit | Attending: Oncology | Admitting: Oncology

## 2015-11-23 DIAGNOSIS — R928 Other abnormal and inconclusive findings on diagnostic imaging of breast: Secondary | ICD-10-CM

## 2015-11-23 HISTORY — PX: BREAST BIOPSY: SHX20

## 2015-11-23 MED ORDER — GADOBENATE DIMEGLUMINE 529 MG/ML IV SOLN
18.0000 mL | Freq: Once | INTRAVENOUS | Status: AC | PRN
  Administered 2015-11-23: 18 mL via INTRAVENOUS

## 2015-11-27 ENCOUNTER — Other Ambulatory Visit: Payer: Self-pay | Admitting: Oncology

## 2015-11-29 ENCOUNTER — Encounter: Payer: Self-pay | Admitting: *Deleted

## 2015-11-29 NOTE — Progress Notes (Signed)
[place] Psychosocial Distress Screening Clinical Social Work  Clinical Social Work was referred by distress screening protocol.  The patient scored a 5 on the Psychosocial Distress Thermometer which indicates moderate distress. Clinical Social Worker phoned pt to assess for distress and other psychosocial needs. CSW left brief, HIPPA compliant message describing role of CSW/Support Programsfor pt to return CSW call as needed.   ONCBCN DISTRESS SCREENING 11/05/2015  Screening Type Initial Screening  Distress experienced in past week (1-10) 5  Emotional problem type Adjusting to illness    Clinical Social Worker follow up needed: No.  If yes, follow up plan:  Loren Racer, [place]  [facility] Phone: [phone] Fax: [phone]

## 2015-11-30 ENCOUNTER — Telehealth: Payer: Self-pay | Admitting: *Deleted

## 2015-11-30 NOTE — Telephone Encounter (Signed)
Pt called to cancel her appt with Dr. Brantley Stage on 11/13. She wishes to speak to Dr. Jana Hakim for her f/u appt to discuss her options for treatment. She wishes to avoid sx if possible. Physician team notified. Pt continue her AI as prescribed by Dr. Jana Hakim without difficulties.

## 2016-01-02 ENCOUNTER — Other Ambulatory Visit: Payer: Self-pay

## 2016-01-02 DIAGNOSIS — D0512 Intraductal carcinoma in situ of left breast: Secondary | ICD-10-CM

## 2016-01-03 ENCOUNTER — Ambulatory Visit (HOSPITAL_BASED_OUTPATIENT_CLINIC_OR_DEPARTMENT_OTHER): Payer: 59 | Admitting: Oncology

## 2016-01-03 ENCOUNTER — Other Ambulatory Visit (HOSPITAL_BASED_OUTPATIENT_CLINIC_OR_DEPARTMENT_OTHER): Payer: 59

## 2016-01-03 ENCOUNTER — Ambulatory Visit: Payer: Self-pay | Admitting: Surgery

## 2016-01-03 VITALS — BP 134/74 | HR 75 | Temp 98.0°F | Resp 18 | Ht 65.0 in | Wt 192.1 lb

## 2016-01-03 DIAGNOSIS — D0512 Intraductal carcinoma in situ of left breast: Secondary | ICD-10-CM

## 2016-01-03 DIAGNOSIS — D0501 Lobular carcinoma in situ of right breast: Secondary | ICD-10-CM

## 2016-01-03 LAB — COMPREHENSIVE METABOLIC PANEL
ALT: 16 U/L (ref 0–55)
AST: 13 U/L (ref 5–34)
Albumin: 3.6 g/dL (ref 3.5–5.0)
Alkaline Phosphatase: 63 U/L (ref 40–150)
Anion Gap: 8 mEq/L (ref 3–11)
BUN: 13.4 mg/dL (ref 7.0–26.0)
CALCIUM: 9.2 mg/dL (ref 8.4–10.4)
CHLORIDE: 109 meq/L (ref 98–109)
CO2: 25 meq/L (ref 22–29)
CREATININE: 0.8 mg/dL (ref 0.6–1.1)
EGFR: 83 mL/min/{1.73_m2} — ABNORMAL LOW (ref >90)
Glucose: 103 mg/dl (ref 70–140)
POTASSIUM: 3.8 meq/L (ref 3.5–5.1)
Sodium: 142 mEq/L (ref 136–145)
Total Bilirubin: 0.31 mg/dL (ref 0.20–1.20)
Total Protein: 6.7 g/dL (ref 6.4–8.3)

## 2016-01-03 LAB — CBC WITH DIFFERENTIAL/PLATELET
BASO%: 0.3 % (ref 0.0–2.0)
BASOS ABS: 0 10*3/uL (ref 0.0–0.1)
EOS%: 1.2 % (ref 0.0–7.0)
Eosinophils Absolute: 0.1 10*3/uL (ref 0.0–0.5)
HCT: 36.1 % (ref 34.8–46.6)
HGB: 12.1 g/dL (ref 11.6–15.9)
LYMPH%: 36.8 % (ref 14.0–49.7)
MCH: 28.7 pg (ref 25.1–34.0)
MCHC: 33.5 g/dL (ref 31.5–36.0)
MCV: 85.7 fL (ref 79.5–101.0)
MONO#: 0.4 10*3/uL (ref 0.1–0.9)
MONO%: 6.5 % (ref 0.0–14.0)
NEUT#: 3.2 10*3/uL (ref 1.5–6.5)
NEUT%: 55.2 % (ref 38.4–76.8)
Platelets: 250 10*3/uL (ref 145–400)
RBC: 4.21 10*6/uL (ref 3.70–5.45)
RDW: 13 % (ref 11.2–14.5)
WBC: 5.7 10*3/uL (ref 3.9–10.3)
lymph#: 2.1 10*3/uL (ref 0.9–3.3)

## 2016-01-03 NOTE — Progress Notes (Signed)
[facility]  [phone] [phone]) [phone]     ID: [name] DOB: 1955-03-30  MR#: [id number]  [id number]  Patient Care Team: Jennifer Solian, MD as PCP - General (Internal Medicine) Jennifer Cruel, MD as Consulting Physician (Oncology) Jennifer Luna, MD as Consulting Physician (General Surgery) Jennifer Campbell, MD as Consulting Physician (Gastroenterology) Jennifer Cruel, MD OTHER MD:  CHIEF COMPLAINT: Ductal carcinoma in situ, LCIS  CURRENT TREATMENT: Awaiting definitive surgery   BREAST CANCER HISTORY: From the original intake note:  Jennifer Rodgers had routine screening mammography in September 2017 suggesting possible areas of distortion in both breasts. On 10/22/2015 she underwent bilateral diagnostic mammography with tomography at the [facility]. The Breast density was category C.  In the right breast there was an area of persistent distortion at the 12:00 region. There was also an area of distortion in the subareolar region of the left breast.  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  The patient's subsequent history is as detailed below   INTERVAL HISTORY: Jennifer Rodgers returns today for follow-up of her left-sided ductal carcinoma in situ, accompanied by her husband Jennifer Rodgers. Since her last visit here she had bilateral breast MRIs 10/26/2017which showed, on the left, the area of biopsy-proven low-grade ductal carcinoma in situ in the subareolar left breast. In the right breast, in addition to the previously biopsied area in the upper right breast which showed LCIS, there were 2 other areas of concern, one in the 1:00 position measuring 1.1 cm,the other in the 2:00 axis measuring 1.0 cm. biopsy of these 2 areas was scheduled for  11/23/2015. However the suspicious area in the 2:00 location was no longer apparent. The area in the 1:00 location was biopsied and proved to be lobular carcinoma in situ (SAA [id number]).  Because of the lobular carcinoma in situ in the contralateral breast, the patient is excluded from participation in the COMET trial. She is here to review her options.  In addition, she was started on tamoxifen in November 2017 she is tolerating this with mild hot flashes, but no vaginal wetness.   REVIEW OF SYSTEMS: Jennifer Rodgers is generally doing well. She complains of occasional ankle swelling. She has a little bit of upper respiratory stuffiness. A detailed review of systems today was otherwise entirely stable  PAST MEDICAL HISTORY: Past Medical History:  Diagnosis Date  . Abdominal pain, unspecified site   . Acute pharyngitis   . Acute upper respiratory infections of unspecified site   . Allergic rhinitis, cause unspecified   . Breast cancer ([facility]) 10/24/2015   Left breast   . Degeneration of cervical intervertebral disc   . Diaphragmatic hernia without mention of obstruction or gangrene   . Dysuria   . Esophageal reflux   . Migraine without aura, without mention of intractable migraine without mention of status migrainosus   . Nausea alone   . Other constipation   . Other malaise and fatigue   . Screening for depression   . Unspecified hypothyroidism   . Unspecified venous (peripheral) insufficiency   . Urinary frequency     PAST SURGICAL HISTORY: Past Surgical History:  Procedure Laterality Date  . COLONOSCOPY  03/03/2011  . LUMBAR LAMINECTOMY  2005  . SALPINGOOPHORECTOMY  2004  . SHOULDER ARTHROSCOPY Left 2008  . THYROID SURGERY  2006  resection of L nodule    FAMILY HISTORY Family History  Problem Relation Age of Onset  . Hypertension Mother   . Thyroid disease Mother   . Hyperlipidemia Mother   . Cancer Mother     breast  . Hypertension Sister   . Hyperlipidemia Sister   .  Diabetes Sister   . Hypertension Daughter   . Hyperlipidemia Daughter   The patient has no information regarding her father or his Rodgers of the family. The patient's mother is alive at age 66. She developed breast cancer in her early 93s. She has also been found to have multiple polyps. She has not had genetics testing. The patient has no brothers. She has 3 half-sisters. There is no history of breast or ovarian cancer in the family otherwise than as noted  GYNECOLOGIC HISTORY:  No LMP recorded. Patient has had a hysterectomy. Menarche age 82, first live birth age 89, the patient is GX P1. She underwent TAH-BSO in 2004. She has been on hormone replacement with estrogen until 2017.  SOCIAL HISTORY:  Jennifer Rodgers works in Programmer, applications. Jennifer Rodgers is retired. He used to work as a Paediatric nurse. Daughter Alleen Rodgers lives in pleasant [place] where she works in Insurance underwriter out of her home. The patient has 2 grandchildren and 2 great-grandchildren. She is not a Ambulance person.    ADVANCED DIRECTIVES: Not in place   HEALTH MAINTENANCE: Social History  Substance Use Topics  . Smoking status: Never Smoker  . Smokeless tobacco: Never Used  . Alcohol use No     Colonoscopy: March 2014/[facility]  PAP: Status post hysterectomy  Bone density: Never   Allergies  Allergen Reactions  . Prednisone Other (See Comments)    Extreme facial flushing and confusion    Current Outpatient Prescriptions  Medication Sig Dispense Refill  . conjugated estrogens (PREMARIN) vaginal cream Place 1 Applicatorful vaginally daily. 42.5 g 12  . Omega-3 Fatty Acids (FISH OIL CONCENTRATE PO) Take 2 capsules by mouth daily.    . pantoprazole (PROTONIX) 40 MG tablet Take 40 mg by mouth daily.    Marland Kitchen SYNTHROID 100 MCG tablet Take 1 tablet by mouth daily.    . vitamin B-12 (CYANOCOBALAMIN) 1000 MCG tablet Take 1,000 mcg by mouth 2 (two) times daily.    . vitamin C (ASCORBIC ACID) 250 MG tablet Take 250 mg by mouth  2 (two) times daily.    Marland Kitchen VITAMIN D, CHOLECALCIFEROL, PO Take 1,000 Units by mouth daily.      No current facility-administered medications for this visit.     OBJECTIVE: Middle-aged white woman who appears well  Vitals:   01/03/16 1400  BP: 134/74  Pulse: 75  Resp: 18  Temp: 98 F (36.7 C)     Body mass index is 31.97 kg/m.    ECOG FS:0 - Asymptomatic  Sclerae unicteric, pupils round and equal Oropharynx clear and moist-- no thrush or other lesions No cervical or supraclavicular adenopathy Lungs no rales or rhonchi Heart regular rate and rhythm Abd soft, nontender, positive bowel sounds MSK no focal spinal tenderness, no upper extremity lymphedema Neuro: nonfocal, well oriented, appropriate affect Breasts: I do not palpate a suspicious mass in either breast. Both axillae are benign.  LAB RESULTS:  CMP     Component Value Date/Time   NA 142 01/03/2016 1341   K 3.8 01/03/2016 1341   CO2 25 01/03/2016 1341   GLUCOSE 103 01/03/2016 1341   BUN 13.4 01/03/2016 1341   CREATININE 0.8  01/03/2016 1341   CALCIUM 9.2 01/03/2016 1341   PROT 6.7 01/03/2016 1341   ALBUMIN 3.6 01/03/2016 1341   AST 13 01/03/2016 1341   ALT 16 01/03/2016 1341   ALKPHOS 63 01/03/2016 1341   BILITOT 0.31 01/03/2016 1341    INo results found for: SPEP, UPEP  Lab Results  Component Value Date   WBC 5.7 01/03/2016   NEUTROABS 3.2 01/03/2016   HGB 12.1 01/03/2016   HCT 36.1 01/03/2016   MCV 85.7 01/03/2016   PLT 250 01/03/2016      Chemistry      Component Value Date/Time   NA 142 01/03/2016 1341   K 3.8 01/03/2016 1341   CO2 25 01/03/2016 1341   BUN 13.4 01/03/2016 1341   CREATININE 0.8 01/03/2016 1341      Component Value Date/Time   CALCIUM 9.2 01/03/2016 1341   ALKPHOS 63 01/03/2016 1341   AST 13 01/03/2016 1341   ALT 16 01/03/2016 1341   BILITOT 0.31 01/03/2016 1341       No results found for: LABCA2  No components found for: [id number]  No results for input(s): INR in  the last 168 hours.  Urinalysis No results found for: COLORURINE, APPEARANCEUR, LABSPEC, PHURINE, GLUCOSEU, HGBUR, BILIRUBINUR, KETONESUR, PROTEINUR, UROBILINOGEN, NITRITE, LEUKOCYTESUR   STUDIES: No results found.  ELIGIBLE FOR AVAILABLE RESEARCH PROTOCOL: COMET  ASSESSMENT: 60 y.o. status post left breast biopsy 10/24/2015 showing ductal carcinoma in situ, low-grade, estrogen and progesterone receptor positive, involving a complex sclerosing lesion, and LCIS  (a) right sided LCIS also biopsied 10/24/2015  (1) the patient considered the COMET trial but is ineligible because of contralateral LCIS  (2) breast MRI 11/15/2015 showed 2 additional masses in the right breast not seen by prior studies  (a) biopsy of an upper inner quadrant right breast lesion showed lobular carcinoma in situ.  (b) the additional right breast lesion noted in 11/15/2015 was not apparent on the 11/23/2015 MRI  (3) definitive surgery pending  (4) adjuvant radiation to follow  (5) tamoxifen started November 2017, to be continued for total of 5 years  PLAN: Korissa's situation would be complex under the best of circumstances because she had bilateral disease, one of which was noninvasive but precancerous (the DCIS) and the other, in the contralateral breast, noninvasive and not clearly precancerous. In addition to all these complications, we discussed the COMET trial, which suggested she could go on observation alone since her tumor was low-grade and estrogen receptor positive.  However she did not qualify for the trial because of the contralateral LCIS. I do not have data to show that observation alone is safe. In addition, outside the trial, if we wanted to obtain MRIs every 6 months that would not be paid for by insurance. She could not afford that. Her breasts are still dense, category C, so simply doing mammograms even with tomography twice a year would not be adequate.  Accordingly she needs to proceed to  surgery and radiation which is the standard of care in the setting for DCIS. Excision of the contralateral breast upper inner quadrant can also be considered to make sure there is no invasive component in that complex area. She will be discussing this with Dr. Brantley Stage at the nearest opportunity.  In the meantime she will continue on tamoxifen. She is tolerating this well. The plan is to continue that for a total of 5 years.  I have requested an appointment with radiation oncology for her in January. She will see me  again in February. Likely we will be able to start long-term follow-up at that time.  Jennifer Cruel, MD   01/03/2016 6:16 PM Medical Oncology and Hematology [facility] [address] [place], [place] [address]

## 2016-01-21 DIAGNOSIS — Z923 Personal history of irradiation: Secondary | ICD-10-CM

## 2016-01-21 HISTORY — PX: BREAST LUMPECTOMY: SHX2

## 2016-01-21 HISTORY — DX: Personal history of irradiation: Z92.3

## 2016-01-21 HISTORY — PX: BREAST EXCISIONAL BIOPSY: SUR124

## 2016-01-22 ENCOUNTER — Ambulatory Visit: Payer: Self-pay | Admitting: Surgery

## 2016-01-22 DIAGNOSIS — D0511 Intraductal carcinoma in situ of right breast: Secondary | ICD-10-CM

## 2016-01-22 DIAGNOSIS — D0501 Lobular carcinoma in situ of right breast: Secondary | ICD-10-CM | POA: Diagnosis not present

## 2016-01-22 DIAGNOSIS — D0512 Intraductal carcinoma in situ of left breast: Secondary | ICD-10-CM | POA: Diagnosis not present

## 2016-01-22 NOTE — H&P (Signed)
PIPER MARCIAL 01/22/2016 10:43 AM Location: [facility] Patient #: [id number] DOB: 1955/09/10 Married / Language: English / Race: White Female  History of Present Illness Marcello Moores A. Kelsha Older MD; 01/22/2016 11:27 AM) Patient words: breast bilateral         Patient presents after core biopsy at the [facility] for abnormal mammogram. She has very dense breast tissue and underwent a mammogram this year which showed bilateral areas of vague density in the central breast. 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 Patient's mother had breast cancer. Patient denies any pain, nipple discharge or other problem prior to her mammogram.    Patient returns after MRI. This is reviewed with her today. She does not qualify for the COMET trial.    She would like to proceed with bilateral lumpectomies for DCIS and LCIS.                    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  LABS: Not applicable  EXAM: BILATERAL BREAST MRI WITH AND WITHOUT CONTRAST  TECHNIQUE: Multiplanar, multisequence MR images of both breasts were obtained prior to and following the intravenous administration of 18 ml of MultiHance.  THREE-DIMENSIONAL MR IMAGE RENDERING ON INDEPENDENT WORKSTATION:  Three-dimensional MR images were rendered by post-processing of the original MR data on an independent workstation. The three-dimensional MR images were interpreted, and findings are reported in the following complete MRI report for this study. Three dimensional images were evaluated at the independent DynaCad workstation  COMPARISON: Previous exam(s).  FINDINGS: Breast composition: c. Heterogeneous  fibroglandular tissue.  Background parenchymal enhancement: Minimal  Right breast: Biopsy site with associated circumferential enhancement within the upper right breast, 12 o'clock axis region, with associated biopsy clip, consistent with biopsy-proven LCIS (series 9, image 47 ; series 3, image 47).  Approximately 1.5 cm medial and slightly superior to the recent right breast biopsy site, within the upper inner quadrant, 1 o'clock axis region, at anterior depth, is an additional enhancing spiculated mass which measures 1.1 x 0.9 x 1 cm, with persistent kinetics, highly suspicious for MULTIFOCAL DISEASE (series 9, images 45-48 ; series 3, image 46). (additional 6 mm enhancing satellite nodule located 8 mm anterior to the inferior aspect of this spiculated mass slightly increases the overall dimensions).  There is an additional irregular enhancing mass within the upper inner quadrant of the right breast, 2 o'clock axis region, at middle depth, 1.5 cm inferior to the spiculated mass described above, measuring 1 x 0.6 x 0.7 cm, with persistent kinetics, suspicious for additional MULTIFOCAL/MULTICENTRIC DISEASE (series 9, image 66) (additional suspicious non mass enhancement at the lateral margin of this irregular enhancing mass increases the transverse dimension to 2.5 cm (series 9, image 65)).  Incidental note made of scattered benign cysts within the right breast.  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  No additional enhancing masses, abnormal non mass enhancement or other secondary signs of malignancy within the left breast.  Incidental note made of scattered benign cysts within the left breast.  Lymph nodes: No abnormal appearing lymph nodes.  Ancillary findings: None.  IMPRESSION: 1.  Suspicious spiculated mass within the upper inner quadrant of the right breast, 1 o'clock axis region, at anterior depth, approximately 1.5 cm medial to the recent biopsy site/biopsy clip, measuring 1.1 x 0.9 x 1 cm, highly suspicious for ADDITIONAL MULTIFOCAL DISEASE, POSSIBLY INVASIVE DISEASE. 2. Additional suspicious irregular mass within the upper inner quadrant of the right breast, 2 o'clock axis region, at middle depth, located 1.5 cm inferior to the spiculated mass described above, measuring 1 x 0.6 x 0.7 cm (but with adjacent suspicious non mass enhancement increasing the transverse dimension to 2.5 cm), also highly suspicious for additional multifocal/multicentric disease. 3. Biopsy site within the right breast at the 12 o'clock axis corresponding to the site of recently diagnosed LCIS. 4. Biopsy site within the subareolar left breast corresponding to the site of recently diagnosed low grade DCIS involving a complex sclerosing lesion and LCIS. No evidence of additional malignancy within the left breast.  RECOMMENDATION: If breast conservation surgery is being considered, recommend MRI guided biopsies of the 2 suspicious masses described above (Impressions 1 and 2).  BI-RADS CATEGORY 4: Suspicious.   Electronically Signed By: Franki Cabot M.D. On: 11/15/2015 12:09.  The patient is a 61 year old female.   Allergies Shirlean Mylar Gwynn, RMA; 01/22/2016 10:44 AM) PredniSONE *CORTICOSTEROIDS*  Medication History Shirlean Mylar Gwynn, RMA; 01/22/2016 10:44 AM) Tamoxifen Citrate (20MG  Tablet, Oral) Active. Synthroid (100MCG Tablet, Oral) Active. Pantoprazole Sodium (40MG  Tablet DR, Oral) Active. Vitamin D3 (1000UNIT Tablet, Oral) Active. Vitamin C (250MG  Tablet, Oral two times daily) Active. Vitamin B12 (1000MCG Tablet ER, Oral two times daily) Active. Fish Oil (1200MG  Capsule, Oral two times daily) Active. Medications Reconciled    Vitals (Robin Gwynn RMA; 01/22/2016 10:45  AM) 01/22/2016 10:44 AM Weight: 189 lb Height: 65in Body Surface Area: 1.93 m Body Mass Index: 31.45 kg/m  Temp.: 97.95F  Pulse: 92 (Regular)  BP: 138/80 (Sitting, Left Arm, Standard)      Physical Exam (Tarica Harl A. Angelynn Lemus MD; 01/22/2016 11:28 AM)  General Mental Status-Alert. General Appearance-Consistent with stated age. Hydration-Well hydrated. Voice-Normal.  Breast Breast - Left-Symmetric, Non Tender, No Biopsy scars, no Dimpling, No Inflammation, No Lumpectomy scars, No Mastectomy scars, No Peau d' Orange. Breast - Right-Symmetric, Non Tender, No Biopsy scars, no Dimpling, No Inflammation, No Lumpectomy scars, No Mastectomy scars, No Peau d' Orange. Breast Lump-No Palpable Breast Mass.  Lymphatic Axillary  General Axillary Region: Bilateral - Description - Normal. Tenderness - Non Tender.    Assessment & Plan (Jillayne Witte A. Dontavion Noxon MD; 01/22/2016 11:28 AM)  BREAST NEOPLASM, TIS (LCIS), RIGHT (D05.01) Impression: Recommendation lumpectomy Risk of lumpectomy include bleeding, infection, seroma, more surgery, use of seed/wire, wound care, cosmetic deformity and the need for other treatments, death , blood clots, death. Pt agrees to proceed.  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 Pt agrees to proceed.  BREAST NEOPLASM, TIS (DCIS), LEFT (D05.12) Impression: Discussed bilateral lumpectomy with her. This may alter her treatment plan which I discussed with her today. If not, I recommended bilateral lumpectomies. Risks, benefits and alternative therapies discussed. I also discussed the possibility of bilateral mastectomy in the setting of LCIS since this increases her lifetime risk of breast cancer considerably. She has no interest in doing  that and may qualify for yearly MRI given her dense breast tissue in diagnosis of LCIS. Risk of lumpectomy include bleeding,  infection, seroma, more surgery, use of seed/wire, wound care, cosmetic deformity and the need for other treatments, death , blood clots, death. Pt agrees to proceed.  Current Plans You are being scheduled for surgery- Our schedulers will call you.  You should hear from our office's scheduling department within 5 working days about the location, date, and time of surgery. We try to make accommodations for patient's preferences in scheduling surgery, but sometimes the OR schedule or the surgeon's schedule prevents [place] from making those accommodations.  If you have not heard from our office [phone]) in 5 working days, call the office and ask for your surgeon's nurse.  If you have other questions about your diagnosis, plan, or surgery, call the office and ask for your surgeon's nurse.  Pt Education - Pamphlet Given - Breast Biopsy: discussed with patient and provided information. We discussed the staging and pathophysiology of breast cancer. We discussed all of the different options for treatment for breast cancer including surgery, chemotherapy, radiation therapy, Herceptin, and antiestrogen therapy. We discussed a sentinel lymph node biopsy as she does not appear to having lymph node involvement right now. We discussed the performance of that with injection of radioactive tracer and blue dye. We discussed that she would have an incision underneath her axillary hairline. We discussed that there is a bout a 10-20% chance of having a positive node with a sentinel lymph node biopsy and we will await the permanent pathology to make any other first further decisions in terms of her treatment. One of these options might be to return to the operating room to perform an axillary lymph node dissection. We discussed about a 1-2% risk lifetime of chronic shoulder pain as well as  lymphedema associated with a sentinel lymph node biopsy. We discussed the options for treatment of the breast cancer which included lumpectomy versus a mastectomy. We discussed the performance of the lumpectomy with a wire placement. We discussed a 10-20% chance of a positive margin requiring reexcision in the operating room. We also discussed that she may need radiation therapy or antiestrogen therapy or both if she undergoes lumpectomy. We discussed the mastectomy and the postoperative care for that as well. We discussed that there is no difference in her survival whether she undergoes lumpectomy with radiation therapy or antiestrogen therapy versus a mastectomy. There is a slight difference in the local recurrence rate being 3-5% with lumpectomy and about 1% with a mastectomy. We discussed the risks of operation including bleeding, infection, possible reoperation. She understands her further therapy will be based on what her stages at the time of her operation.  Pt Education - [facility] breast cancer surgery: discussed with patient and provided information. Pt Education - [facility] Breast Biopsy HCI: discussed with patient and provided information.

## 2016-01-25 ENCOUNTER — Encounter (HOSPITAL_BASED_OUTPATIENT_CLINIC_OR_DEPARTMENT_OTHER): Payer: Self-pay | Admitting: *Deleted

## 2016-01-28 ENCOUNTER — Encounter (HOSPITAL_BASED_OUTPATIENT_CLINIC_OR_DEPARTMENT_OTHER)
Admission: RE | Admit: 2016-01-28 | Discharge: 2016-01-28 | Disposition: A | Discharge status: Home / Self Care | Payer: 59 | Source: Ambulatory Visit | Attending: Surgery | Admitting: Surgery

## 2016-01-28 DIAGNOSIS — E039 Hypothyroidism, unspecified: Secondary | ICD-10-CM | POA: Diagnosis not present

## 2016-01-28 DIAGNOSIS — D0512 Intraductal carcinoma in situ of left breast: Secondary | ICD-10-CM | POA: Diagnosis not present

## 2016-01-28 DIAGNOSIS — D0511 Intraductal carcinoma in situ of right breast: Secondary | ICD-10-CM | POA: Diagnosis not present

## 2016-01-28 DIAGNOSIS — N6021 Fibroadenosis of right breast: Secondary | ICD-10-CM | POA: Diagnosis not present

## 2016-01-28 DIAGNOSIS — I739 Peripheral vascular disease, unspecified: Secondary | ICD-10-CM | POA: Diagnosis not present

## 2016-01-28 DIAGNOSIS — Z803 Family history of malignant neoplasm of breast: Secondary | ICD-10-CM | POA: Diagnosis not present

## 2016-01-28 DIAGNOSIS — M1990 Unspecified osteoarthritis, unspecified site: Secondary | ICD-10-CM | POA: Diagnosis not present

## 2016-01-28 DIAGNOSIS — K219 Gastro-esophageal reflux disease without esophagitis: Secondary | ICD-10-CM | POA: Diagnosis not present

## 2016-01-28 LAB — CBC WITH DIFFERENTIAL/PLATELET
BASOS ABS: 0 10*3/uL (ref 0.0–0.1)
BASOS PCT: 0 %
Eosinophils Absolute: 0.1 10*3/uL (ref 0.0–0.7)
Eosinophils Relative: 1 %
HEMATOCRIT: 36 % (ref 36.0–46.0)
HEMOGLOBIN: 11.8 g/dL — AB (ref 12.0–15.0)
LYMPHS PCT: 38 %
Lymphs Abs: 2 10*3/uL (ref 0.7–4.0)
MCH: 28.6 pg (ref 26.0–34.0)
MCHC: 32.8 g/dL (ref 30.0–36.0)
MCV: 87.4 fL (ref 78.0–100.0)
MONOS PCT: 6 %
Monocytes Absolute: 0.3 10*3/uL (ref 0.1–1.0)
NEUTROS ABS: 2.9 10*3/uL (ref 1.7–7.7)
NEUTROS PCT: 55 %
Platelets: 272 10*3/uL (ref 150–400)
RBC: 4.12 MIL/uL (ref 3.87–5.11)
RDW: 13.3 % (ref 11.5–15.5)
WBC: 5.3 10*3/uL (ref 4.0–10.5)

## 2016-01-28 LAB — COMPREHENSIVE METABOLIC PANEL
ALBUMIN: 3.6 g/dL (ref 3.5–5.0)
ALK PHOS: 49 U/L (ref 38–126)
ALT: 15 U/L (ref 14–54)
AST: 14 U/L — AB (ref 15–41)
Anion gap: 7 (ref 5–15)
BILIRUBIN TOTAL: 0.4 mg/dL (ref 0.3–1.2)
BUN: 18 mg/dL (ref 6–20)
CALCIUM: 9.1 mg/dL (ref 8.9–10.3)
CO2: 28 mmol/L (ref 22–32)
CREATININE: 0.97 mg/dL (ref 0.44–1.00)
Chloride: 105 mmol/L (ref 101–111)
GFR calc Af Amer: >60 mL/min (ref >60)
GFR calc non Af Amer: >60 mL/min (ref >60)
GLUCOSE: 100 mg/dL — AB (ref 65–99)
Potassium: 4.2 mmol/L (ref 3.5–5.1)
Sodium: 140 mmol/L (ref 135–145)
TOTAL PROTEIN: 6.5 g/dL (ref 6.5–8.1)

## 2016-01-28 NOTE — Progress Notes (Signed)
Pt instructed to drink Boost by 0700 or 0730 before she goes for her wire with teach back method.

## 2016-01-31 ENCOUNTER — Ambulatory Visit (HOSPITAL_BASED_OUTPATIENT_CLINIC_OR_DEPARTMENT_OTHER): Payer: 59 | Admitting: Anesthesiology

## 2016-01-31 ENCOUNTER — Ambulatory Visit (HOSPITAL_BASED_OUTPATIENT_CLINIC_OR_DEPARTMENT_OTHER)
Admission: RE | Admit: 2016-01-31 | Discharge: 2016-01-31 | Disposition: A | Discharge status: Home / Self Care | Payer: 59 | Source: Ambulatory Visit | Attending: Surgery | Admitting: Surgery

## 2016-01-31 ENCOUNTER — Ambulatory Visit
Admission: RE | Admit: 2016-01-31 | Discharge: 2016-01-31 | Disposition: A | Discharge status: Home / Self Care | Payer: 59 | Source: Ambulatory Visit | Attending: Surgery | Admitting: Surgery

## 2016-01-31 ENCOUNTER — Ambulatory Visit
Admission: RE | Admit: 2016-01-31 | Discharge: 2016-01-31 | Disposition: A | Discharge status: Home / Self Care | Payer: 59 | Source: Ambulatory Visit | Attending: Oncology | Admitting: Oncology

## 2016-01-31 ENCOUNTER — Ambulatory Visit
Admit: 2016-01-31 | Discharge: 2016-01-31 | Disposition: A | Discharge status: Home / Self Care | Payer: 59 | Attending: Surgery | Admitting: Surgery

## 2016-01-31 ENCOUNTER — Encounter (HOSPITAL_BASED_OUTPATIENT_CLINIC_OR_DEPARTMENT_OTHER)
Admission: RE | Disposition: A | Discharge status: Home / Self Care | Payer: Self-pay | Source: Ambulatory Visit | Attending: Surgery

## 2016-01-31 ENCOUNTER — Encounter (HOSPITAL_BASED_OUTPATIENT_CLINIC_OR_DEPARTMENT_OTHER): Payer: Self-pay | Admitting: Anesthesiology

## 2016-01-31 DIAGNOSIS — N6021 Fibroadenosis of right breast: Secondary | ICD-10-CM | POA: Diagnosis not present

## 2016-01-31 DIAGNOSIS — M1990 Unspecified osteoarthritis, unspecified site: Secondary | ICD-10-CM | POA: Insufficient documentation

## 2016-01-31 DIAGNOSIS — D0511 Intraductal carcinoma in situ of right breast: Secondary | ICD-10-CM

## 2016-01-31 DIAGNOSIS — D0512 Intraductal carcinoma in situ of left breast: Secondary | ICD-10-CM | POA: Diagnosis not present

## 2016-01-31 DIAGNOSIS — D4861 Neoplasm of uncertain behavior of right breast: Secondary | ICD-10-CM | POA: Diagnosis not present

## 2016-01-31 DIAGNOSIS — D0501 Lobular carcinoma in situ of right breast: Secondary | ICD-10-CM | POA: Diagnosis not present

## 2016-01-31 DIAGNOSIS — K449 Diaphragmatic hernia without obstruction or gangrene: Secondary | ICD-10-CM | POA: Diagnosis not present

## 2016-01-31 DIAGNOSIS — E039 Hypothyroidism, unspecified: Secondary | ICD-10-CM | POA: Insufficient documentation

## 2016-01-31 DIAGNOSIS — K219 Gastro-esophageal reflux disease without esophagitis: Secondary | ICD-10-CM | POA: Insufficient documentation

## 2016-01-31 DIAGNOSIS — Z803 Family history of malignant neoplasm of breast: Secondary | ICD-10-CM | POA: Insufficient documentation

## 2016-01-31 DIAGNOSIS — I739 Peripheral vascular disease, unspecified: Secondary | ICD-10-CM | POA: Insufficient documentation

## 2016-01-31 DIAGNOSIS — D0502 Lobular carcinoma in situ of left breast: Secondary | ICD-10-CM | POA: Diagnosis not present

## 2016-01-31 DIAGNOSIS — R928 Other abnormal and inconclusive findings on diagnostic imaging of breast: Secondary | ICD-10-CM

## 2016-01-31 HISTORY — DX: Nausea with vomiting, unspecified: Z98.890

## 2016-01-31 HISTORY — DX: Other specified postprocedural states: R11.2

## 2016-01-31 HISTORY — PX: BREAST LUMPECTOMY WITH NEEDLE LOCALIZATION: SHX5759

## 2016-01-31 SURGERY — BREAST LUMPECTOMY WITH NEEDLE LOCALIZATION
Anesthesia: General | Site: Breast | Laterality: Bilateral

## 2016-01-31 MED ORDER — FENTANYL CITRATE (PF) 100 MCG/2ML IJ SOLN
INTRAMUSCULAR | Status: AC
  Filled 2016-01-31: qty 2

## 2016-01-31 MED ORDER — MIDAZOLAM HCL 2 MG/2ML IJ SOLN
INTRAMUSCULAR | Status: AC
  Filled 2016-01-31: qty 2

## 2016-01-31 MED ORDER — HYDROCODONE-ACETAMINOPHEN 5-325 MG PO TABS: 1.0000 | ORAL_TABLET | Freq: Four times a day (QID) | ORAL | 0 refills | Status: DC | PRN

## 2016-01-31 MED ORDER — CHLORHEXIDINE GLUCONATE CLOTH 2 % EX PADS: 6.0000 | MEDICATED_PAD | Freq: Once | CUTANEOUS | Status: DC

## 2016-01-31 MED ORDER — EPHEDRINE 5 MG/ML INJ
INTRAVENOUS | Status: AC
  Filled 2016-01-31: qty 20

## 2016-01-31 MED ORDER — ONDANSETRON HCL 4 MG/2ML IJ SOLN
INTRAMUSCULAR | Status: DC | PRN
  Administered 2016-01-31: 4 mg via INTRAVENOUS

## 2016-01-31 MED ORDER — CELECOXIB 200 MG PO CAPS
ORAL_CAPSULE | ORAL | Status: AC
  Filled 2016-01-31: qty 2

## 2016-01-31 MED ORDER — BUPIVACAINE HCL (PF) 0.25 % IJ SOLN
INTRAMUSCULAR | Status: DC | PRN
  Administered 2016-01-31: 30 mL

## 2016-01-31 MED ORDER — FENTANYL CITRATE (PF) 100 MCG/2ML IJ SOLN
50.0000 ug | INTRAMUSCULAR | Status: DC | PRN
  Administered 2016-01-31: 50 ug via INTRAVENOUS
  Administered 2016-01-31: 100 ug via INTRAVENOUS

## 2016-01-31 MED ORDER — GABAPENTIN 300 MG PO CAPS
ORAL_CAPSULE | ORAL | Status: AC
  Filled 2016-01-31: qty 1

## 2016-01-31 MED ORDER — EPHEDRINE SULFATE 50 MG/ML IJ SOLN
INTRAMUSCULAR | Status: DC | PRN
  Administered 2016-01-31 (×5): 10 mg via INTRAVENOUS

## 2016-01-31 MED ORDER — LACTATED RINGERS IV SOLN
INTRAVENOUS | Status: DC
  Administered 2016-01-31 (×3): via INTRAVENOUS

## 2016-01-31 MED ORDER — ACETAMINOPHEN 500 MG PO TABS
1000.0000 mg | ORAL_TABLET | ORAL | Status: AC
  Administered 2016-01-31: 1000 mg via ORAL

## 2016-01-31 MED ORDER — LIDOCAINE HCL (CARDIAC) 20 MG/ML IV SOLN
INTRAVENOUS | Status: DC | PRN
  Administered 2016-01-31: 60 mg via INTRAVENOUS

## 2016-01-31 MED ORDER — ACETAMINOPHEN 500 MG PO TABS
ORAL_TABLET | ORAL | Status: AC
  Filled 2016-01-31: qty 2

## 2016-01-31 MED ORDER — GABAPENTIN 300 MG PO CAPS
300.0000 mg | ORAL_CAPSULE | ORAL | Status: AC
  Administered 2016-01-31: 300 mg via ORAL

## 2016-01-31 MED ORDER — SCOPOLAMINE 1 MG/3DAYS TD PT72
1.0000 | MEDICATED_PATCH | Freq: Once | TRANSDERMAL | Status: DC | PRN
  Administered 2016-01-31: 1.5 mg via TRANSDERMAL

## 2016-01-31 MED ORDER — PROPOFOL 10 MG/ML IV BOLUS
INTRAVENOUS | Status: DC | PRN
  Administered 2016-01-31: 150 mg via INTRAVENOUS

## 2016-01-31 MED ORDER — ONDANSETRON HCL 4 MG/2ML IJ SOLN
INTRAMUSCULAR | Status: AC
  Filled 2016-01-31: qty 2

## 2016-01-31 MED ORDER — MIDAZOLAM HCL 2 MG/2ML IJ SOLN
1.0000 mg | INTRAMUSCULAR | Status: DC | PRN
  Administered 2016-01-31: 2 mg via INTRAVENOUS

## 2016-01-31 MED ORDER — LIDOCAINE 2% (20 MG/ML) 5 ML SYRINGE
INTRAMUSCULAR | Status: AC
  Filled 2016-01-31: qty 25

## 2016-01-31 MED ORDER — HYDROMORPHONE HCL 1 MG/ML IJ SOLN: 0.2500 mg | INTRAMUSCULAR | Status: DC | PRN

## 2016-01-31 MED ORDER — CEFAZOLIN SODIUM-DEXTROSE 2-4 GM/100ML-% IV SOLN
INTRAVENOUS | Status: AC
  Filled 2016-01-31: qty 100

## 2016-01-31 MED ORDER — CELECOXIB 400 MG PO CAPS
400.0000 mg | ORAL_CAPSULE | ORAL | Status: AC
  Administered 2016-01-31: 400 mg via ORAL

## 2016-01-31 MED ORDER — DEXAMETHASONE SODIUM PHOSPHATE 10 MG/ML IJ SOLN
INTRAMUSCULAR | Status: AC
  Filled 2016-01-31: qty 1

## 2016-01-31 MED ORDER — DEXTROSE 5 % IV SOLN
3.0000 g | INTRAVENOUS | Status: AC
  Administered 2016-01-31: 2 g via INTRAVENOUS

## 2016-01-31 SURGICAL SUPPLY — 48 items
ADH SKN CLS APL DERMABOND .7 (GAUZE/BANDAGES/DRESSINGS) ×2
APPLIER CLIP 9.375 MED OPEN (MISCELLANEOUS) ×2
APR CLP MED 9.3 20 MLT OPN (MISCELLANEOUS) ×1
BINDER BREAST XLRG (GAUZE/BANDAGES/DRESSINGS) ×1 IMPLANT
BIOPATCH RED 1 DISK 7.0 (GAUZE/BANDAGES/DRESSINGS) IMPLANT
BLADE SURG 15 STRL LF DISP TIS (BLADE) ×1 IMPLANT
BLADE SURG 15 STRL SS (BLADE) ×4
CANISTER SUCT 1200ML W/VALVE (MISCELLANEOUS) ×2 IMPLANT
CHLORAPREP W/TINT 26ML (MISCELLANEOUS) ×2 IMPLANT
CLIP APPLIE 9.375 MED OPEN (MISCELLANEOUS) IMPLANT
COVER BACK TABLE 60X90IN (DRAPES) ×2 IMPLANT
COVER MAYO STAND STRL (DRAPES) ×2 IMPLANT
DECANTER SPIKE VIAL GLASS SM (MISCELLANEOUS) ×2 IMPLANT
DERMABOND ADVANCED (GAUZE/BANDAGES/DRESSINGS) ×2
DERMABOND ADVANCED .7 DNX12 (GAUZE/BANDAGES/DRESSINGS) ×1 IMPLANT
DEVICE DUBIN W/COMP PLATE 8390 (MISCELLANEOUS) ×2 IMPLANT
DRAIN CHANNEL 19F RND (DRAIN) IMPLANT
DRAPE LAPAROSCOPIC ABDOMINAL (DRAPES) ×1 IMPLANT
DRAPE LAPAROTOMY 100X72 PEDS (DRAPES) ×1 IMPLANT
DRAPE UTILITY XL STRL (DRAPES) ×2 IMPLANT
ELECT COATED BLADE 2.86 ST (ELECTRODE) ×2 IMPLANT
ELECT REM PT RETURN 9FT ADLT (ELECTROSURGICAL) ×2
ELECTRODE REM PT RTRN 9FT ADLT (ELECTROSURGICAL) ×1 IMPLANT
EVACUATOR SILICONE 100CC (DRAIN) IMPLANT
GLOVE BIOGEL PI IND STRL 7.5 (GLOVE) IMPLANT
GLOVE BIOGEL PI IND STRL 8 (GLOVE) ×1 IMPLANT
GLOVE BIOGEL PI INDICATOR 7.5 (GLOVE) ×2
GLOVE BIOGEL PI INDICATOR 8 (GLOVE) ×1
GLOVE ECLIPSE 7.5 STRL STRAW (GLOVE) ×1 IMPLANT
GLOVE ECLIPSE 8.0 STRL XLNG CF (GLOVE) ×3 IMPLANT
GOWN STRL REUS W/ TWL LRG LVL3 (GOWN DISPOSABLE) ×2 IMPLANT
GOWN STRL REUS W/TWL LRG LVL3 (GOWN DISPOSABLE) ×4
KIT MARKER MARGIN INK (KITS) ×1 IMPLANT
NDL HYPO 25X1 1.5 SAFETY (NEEDLE) ×1 IMPLANT
NEEDLE HYPO 25X1 1.5 SAFETY (NEEDLE) ×2 IMPLANT
NS IRRIG 1000ML POUR BTL (IV SOLUTION) ×2 IMPLANT
PACK BASIN DAY SURGERY FS (CUSTOM PROCEDURE TRAY) ×2 IMPLANT
PENCIL BUTTON HOLSTER BLD 10FT (ELECTRODE) ×2 IMPLANT
SLEEVE SCD COMPRESS KNEE MED (MISCELLANEOUS) ×2 IMPLANT
SPONGE LAP 4X18 X RAY DECT (DISPOSABLE) ×2 IMPLANT
SUT MON AB 4-0 PC3 18 (SUTURE) ×3 IMPLANT
SUT SILK 2 0 SH (SUTURE) IMPLANT
SUT VICRYL 3-0 CR8 SH (SUTURE) ×3 IMPLANT
SYR CONTROL 10ML LL (SYRINGE) ×2 IMPLANT
TOWEL OR 17X24 6PK STRL BLUE (TOWEL DISPOSABLE) ×3 IMPLANT
TOWEL OR NON WOVEN STRL DISP B (DISPOSABLE) ×2 IMPLANT
TUBE CONNECTING 20X1/4 (TUBING) ×2 IMPLANT
YANKAUER SUCT BULB TIP NO VENT (SUCTIONS) ×2 IMPLANT

## 2016-01-31 NOTE — Anesthesia Procedure Notes (Signed)
Procedure Name: LMA Insertion Date/Time: 01/31/2016 12:01 PM Performed by: Maryella Shivers Pre-anesthesia Checklist: Patient identified, Emergency Drugs available, Suction available and Patient being monitored Patient Re-evaluated:Patient Re-evaluated prior to inductionOxygen Delivery Method: Circle system utilized Preoxygenation: Pre-oxygenation with 100% oxygen Intubation Type: IV induction Ventilation: Mask ventilation without difficulty LMA: LMA inserted LMA Size: 4.0 Number of attempts: 1 Airway Equipment and Method: Bite block Placement Confirmation: positive ETCO2 Tube secured with: Tape Dental Injury: Teeth and Oropharynx as per pre-operative assessment

## 2016-01-31 NOTE — H&P (View-Only) (Signed)
Jennifer Rodgers 01/22/2016 10:43 AM Location: [facility] Patient #: [id number] DOB: 09/26/1955 Married / Language: English / Race: White Female  History of Present Illness Marcello Moores A. Jennifer Ashbaugh MD; 01/22/2016 11:27 AM) Patient words: breast bilateral         Patient presents after core biopsy at [facility] for abnormal mammogram. She has very dense breast tissue and underwent a mammogram this year which showed bilateral areas of vague density in the central breast. 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 Patient's mother had breast cancer. Patient denies any pain, nipple discharge or other problem prior to her mammogram.    Patient returns after MRI. This is reviewed with her today. She does not qualify for the COMET trial.    She would like to proceed with bilateral lumpectomies for DCIS and LCIS.                    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  LABS: Not applicable  EXAM: BILATERAL BREAST MRI WITH AND WITHOUT CONTRAST  TECHNIQUE: Multiplanar, multisequence MR images of both breasts were obtained prior to and following the intravenous administration of 18 ml of MultiHance.  THREE-DIMENSIONAL MR IMAGE RENDERING ON INDEPENDENT WORKSTATION:  Three-dimensional MR images were rendered by post-processing of the original MR data on an independent workstation. The three-dimensional MR images were interpreted, and findings are reported in the following complete MRI report for this study. Three dimensional images were evaluated at the independent DynaCad workstation  COMPARISON: Previous exam(s).  FINDINGS: Breast composition: c. Heterogeneous  fibroglandular tissue.  Background parenchymal enhancement: Minimal  Right breast: Biopsy site with associated circumferential enhancement within the upper right breast, 12 o'clock axis region, with associated biopsy clip, consistent with biopsy-proven LCIS (series 9, image 47 ; series 3, image 47).  Approximately 1.5 cm medial and slightly superior to the recent right breast biopsy site, within the upper inner quadrant, 1 o'clock axis region, at anterior depth, is an additional enhancing spiculated mass which measures 1.1 x 0.9 x 1 cm, with persistent kinetics, highly suspicious for MULTIFOCAL DISEASE (series 9, images 45-48 ; series 3, image 46). (additional 6 mm enhancing satellite nodule located 8 mm anterior to the inferior aspect of this spiculated mass slightly increases the overall dimensions).  There is an additional irregular enhancing mass within the upper inner quadrant of the right breast, 2 o'clock axis region, at middle depth, 1.5 cm inferior to the spiculated mass described above, measuring 1 x 0.6 x 0.7 cm, with persistent kinetics, suspicious for additional MULTIFOCAL/MULTICENTRIC DISEASE (series 9, image 66) (additional suspicious non mass enhancement at the lateral margin of this irregular enhancing mass increases the transverse dimension to 2.5 cm (series 9, image 65)).  Incidental note made of scattered benign cysts within the right breast.  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  No additional enhancing masses, abnormal non mass enhancement or other secondary signs of malignancy within the left breast.  Incidental note made of scattered benign cysts within the left breast.  Lymph nodes: No abnormal appearing lymph nodes.  Ancillary findings: None.  IMPRESSION: 1.  Suspicious spiculated mass within the upper inner quadrant of the right breast, 1 o'clock axis region, at anterior depth, approximately 1.5 cm medial to the recent biopsy site/biopsy clip, measuring 1.1 x 0.9 x 1 cm, highly suspicious for ADDITIONAL MULTIFOCAL DISEASE, POSSIBLY INVASIVE DISEASE. 2. Additional suspicious irregular mass within the upper inner quadrant of the right breast, 2 o'clock axis region, at middle depth, located 1.5 cm inferior to the spiculated mass described above, measuring 1 x 0.6 x 0.7 cm (but with adjacent suspicious non mass enhancement increasing the transverse dimension to 2.5 cm), also highly suspicious for additional multifocal/multicentric disease. 3. Biopsy site within the right breast at the 12 o'clock axis corresponding to the site of recently diagnosed LCIS. 4. Biopsy site within the subareolar left breast corresponding to the site of recently diagnosed low grade DCIS involving a complex sclerosing lesion and LCIS. No evidence of additional malignancy within the left breast.  RECOMMENDATION: If breast conservation surgery is being considered, recommend MRI guided biopsies of the 2 suspicious masses described above (Impressions 1 and 2).  BI-RADS CATEGORY 4: Suspicious.   Electronically Signed By: Franki Cabot M.D. On: 11/15/2015 12:09.  The patient is a 61 year old female.   Allergies Shirlean Mylar Gwynn, RMA; 01/22/2016 10:44 AM) PredniSONE *CORTICOSTEROIDS*  Medication History Shirlean Mylar Gwynn, RMA; 01/22/2016 10:44 AM) Tamoxifen Citrate (20MG  Tablet, Oral) Active. Synthroid (100MCG Tablet, Oral) Active. Pantoprazole Sodium (40MG  Tablet DR, Oral) Active. Vitamin D3 (1000UNIT Tablet, Oral) Active. Vitamin C (250MG  Tablet, Oral two times daily) Active. Vitamin B12 (1000MCG Tablet ER, Oral two times daily) Active. Fish Oil (1200MG  Capsule, Oral two times daily) Active. Medications Reconciled    Vitals (Robin Gwynn RMA; 01/22/2016 10:45  AM) 01/22/2016 10:44 AM Weight: 189 lb Height: 65in Body Surface Area: 1.93 m Body Mass Index: 31.45 kg/m  Temp.: 97.60F  Pulse: 92 (Regular)  BP: 138/80 (Sitting, Left Arm, Standard)      Physical Exam (Moe Brier A. Caliyah Sieh MD; 01/22/2016 11:28 AM)  General Mental Status-Alert. General Appearance-Consistent with stated age. Hydration-Well hydrated. Voice-Normal.  Breast Breast - Left-Symmetric, Non Tender, No Biopsy scars, no Dimpling, No Inflammation, No Lumpectomy scars, No Mastectomy scars, No Peau d' Orange. Breast - Right-Symmetric, Non Tender, No Biopsy scars, no Dimpling, No Inflammation, No Lumpectomy scars, No Mastectomy scars, No Peau d' Orange. Breast Lump-No Palpable Breast Mass.  Lymphatic Axillary  General Axillary Region: Bilateral - Description - Normal. Tenderness - Non Tender.    Assessment & Plan (Maijor Hornig A. Samier Jaco MD; 01/22/2016 11:28 AM)  BREAST NEOPLASM, TIS (LCIS), RIGHT (D05.01) Impression: Recommendation lumpectomy Risk of lumpectomy include bleeding, infection, seroma, more surgery, use of seed/wire, wound care, cosmetic deformity and the need for other treatments, death , blood clots, death. Pt agrees to proceed.  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 Pt agrees to proceed.  BREAST NEOPLASM, TIS (DCIS), LEFT (D05.12) Impression: Discussed bilateral lumpectomy with her. This may alter her treatment plan which I discussed with her today. If not, I recommended bilateral lumpectomies. Risks, benefits and alternative therapies discussed. I also discussed the possibility of bilateral mastectomy in the setting of LCIS since this increases her lifetime risk of breast cancer considerably. She has no interest in doing  that and may qualify for yearly MRI given her dense breast tissue in diagnosis of LCIS. Risk of lumpectomy include bleeding,  infection, seroma, more surgery, use of seed/wire, wound care, cosmetic deformity and the need for other treatments, death , blood clots, death. Pt agrees to proceed.  Current Plans You are being scheduled for surgery- Our schedulers will call you.  You should hear from our office's scheduling department within 5 working days about the location, date, and time of surgery. We try to make accommodations for patient's preferences in scheduling surgery, but sometimes the OR schedule or the surgeon's schedule prevents [place] from making those accommodations.  If you have not heard from our office [phone]) in 5 working days, call the office and ask for your surgeon's nurse.  If you have other questions about your diagnosis, plan, or surgery, call the office and ask for your surgeon's nurse.  Pt Education - Pamphlet Given - Breast Biopsy: discussed with patient and provided information. We discussed the staging and pathophysiology of breast cancer. We discussed all of the different options for treatment for breast cancer including surgery, chemotherapy, radiation therapy, Herceptin, and antiestrogen therapy. We discussed a sentinel lymph node biopsy as she does not appear to having lymph node involvement right now. We discussed the performance of that with injection of radioactive tracer and blue dye. We discussed that she would have an incision underneath her axillary hairline. We discussed that there is a bout a 10-20% chance of having a positive node with a sentinel lymph node biopsy and we will await the permanent pathology to make any other first further decisions in terms of her treatment. One of these options might be to return to the operating room to perform an axillary lymph node dissection. We discussed about a 1-2% risk lifetime of chronic shoulder pain as well as  lymphedema associated with a sentinel lymph node biopsy. We discussed the options for treatment of the breast cancer which included lumpectomy versus a mastectomy. We discussed the performance of the lumpectomy with a wire placement. We discussed a 10-20% chance of a positive margin requiring reexcision in the operating room. We also discussed that she may need radiation therapy or antiestrogen therapy or both if she undergoes lumpectomy. We discussed the mastectomy and the postoperative care for that as well. We discussed that there is no difference in her survival whether she undergoes lumpectomy with radiation therapy or antiestrogen therapy versus a mastectomy. There is a slight difference in the local recurrence rate being 3-5% with lumpectomy and about 1% with a mastectomy. We discussed the risks of operation including bleeding, infection, possible reoperation. She understands her further therapy will be based on what her stages at the time of her operation.  Pt Education - [facility] breast cancer surgery: discussed with patient and provided information. Pt Education - [facility] Breast Biopsy HCI: discussed with patient and provided information.

## 2016-01-31 NOTE — Transfer of Care (Signed)
Immediate Anesthesia Transfer of Care Note  Patient: Jennifer Rodgers  Procedure(s) Performed: Procedure(s): RIGHT BREAST LUMPECTOMY WITH DOUBLE NEEDLE LOCALIZATION, LEFT BREAST NEEDLE LOCALIZED LUMPECTOMY (Bilateral)  Patient Location: PACU  Anesthesia Type:General  Level of Consciousness: awake, alert  and oriented  Airway & Oxygen Therapy: Patient Spontanous Breathing and Patient connected to face mask oxygen  Post-op Assessment: Report given to RN and Post -op Vital signs reviewed and stable  Post vital signs: Reviewed and stable  Last Vitals:  Vitals:   01/31/16 1132 01/31/16 1335  BP: 130/73 108/63  Pulse: 79 (!) 102  Resp: 20   Temp: 36.4 C     Last Pain:  Vitals:   01/31/16 1132  TempSrc: Oral         Complications: No apparent anesthesia complications

## 2016-01-31 NOTE — Anesthesia Postprocedure Evaluation (Signed)
Anesthesia Post Note  Patient: Jennifer Rodgers  Procedure(s) Performed: Procedure(s) (LRB): RIGHT BREAST LUMPECTOMY WITH DOUBLE NEEDLE LOCALIZATION, LEFT BREAST NEEDLE LOCALIZED LUMPECTOMY (Bilateral)  Patient location during evaluation: PACU Anesthesia Type: General Level of consciousness: awake and alert Pain management: pain level controlled Vital Signs Assessment: post-procedure vital signs reviewed and stable Respiratory status: spontaneous breathing, nonlabored ventilation, respiratory function stable and patient connected to nasal cannula oxygen Cardiovascular status: blood pressure returned to baseline and stable Postop Assessment: no signs of nausea or vomiting Anesthetic complications: no       Last Vitals:  Vitals:   01/31/16 1430 01/31/16 1459  BP: 99/60 127/76  Pulse: 78 87  Resp: 14 16  Temp:  36.5 C    Last Pain:  Vitals:   01/31/16 1459  TempSrc:   PainSc: 0-No pain                 Catalina Gravel

## 2016-01-31 NOTE — Interval H&P Note (Signed)
History and Physical Interval Note:  01/31/2016 11:32 AM  Jennifer Rodgers  has presented today for surgery, with the diagnosis of RIGHT BREAST DCIS, LEFT BREAST LCIS  The various methods of treatment have been discussed with the patient and family. After consideration of risks, benefits and other options for treatment, the patient has consented to  Procedure(s): RIGHT BREAST LUMPECTOMY WITH DOUBLE NEEDLE LOCALIZATION, LEFT BREAST NEEDLE LOCALIZED LUMPECTOMY (Bilateral) as a surgical intervention .  The patient's history has been reviewed, patient examined, no change in status, stable for surgery.  I have reviewed the patient's chart and labs.  Questions were answered to the patient's satisfaction.     Londa Mackowski A.

## 2016-01-31 NOTE — Anesthesia Preprocedure Evaluation (Addendum)
Anesthesia Evaluation  Patient identified by MRN, date of birth, ID band Patient awake    Reviewed: Allergy & Precautions, H&P , NPO status , Patient's Chart, lab work & pertinent test results  History of Anesthesia Complications (+) PONV  Airway Mallampati: II  TM Distance: >3 FB Neck ROM: Full    Dental no notable dental hx. (+) Teeth Intact, Dental Advisory Given   Pulmonary neg pulmonary ROS,    Pulmonary exam normal breath sounds clear to auscultation       Cardiovascular + Peripheral Vascular Disease   Rhythm:Regular Rate:Normal     Neuro/Psych  Headaches, negative psych ROS   GI/Hepatic Neg liver ROS, GERD  Medicated and Controlled,  Endo/Other  Hypothyroidism   Renal/GU negative Renal ROS  negative genitourinary   Musculoskeletal  (+) Arthritis , Osteoarthritis,    Abdominal   Peds  Hematology negative hematology ROS (+)   Anesthesia Other Findings   Reproductive/Obstetrics negative OB ROS                            Anesthesia Physical Anesthesia Plan  ASA: II  Anesthesia Plan: General   Post-op Pain Management:    Induction: Intravenous  Airway Management Planned: LMA  Additional Equipment:   Intra-op Plan:   Post-operative Plan: Extubation in OR  Informed Consent: I have reviewed the patients History and Physical, chart, labs and discussed the procedure including the risks, benefits and alternatives for the proposed anesthesia with the patient or authorized representative who has indicated his/her understanding and acceptance.   Dental advisory given  Plan Discussed with: CRNA  Anesthesia Plan Comments:         Anesthesia Quick Evaluation

## 2016-01-31 NOTE — Discharge Instructions (Signed)
[facility] °Office Phone Number [phone] ° °BREAST BIOPSY/ PARTIAL MASTECTOMY: POST OP INSTRUCTIONS ° °Always review your discharge instruction sheet given to you by the facility where your surgery was performed. ° °IF YOU HAVE DISABILITY OR FAMILY LEAVE FORMS, YOU MUST BRING THEM TO THE OFFICE FOR PROCESSING.  DO NOT GIVE THEM TO YOUR DOCTOR. ° °1. A prescription for pain medication may be given to you upon discharge.  Take your pain medication as prescribed, if needed.  If narcotic pain medicine is not needed, then you may take acetaminophen (Tylenol) or ibuprofen (Advil) as needed. °2. Take your usually prescribed medications unless otherwise directed °3. If you need a refill on your pain medication, please contact your pharmacy.  They will contact our office to request authorization.  Prescriptions will not be filled after 5pm or on week-ends. °4. You should eat very light the first 24 hours after surgery, such as soup, crackers, pudding, etc.  Resume your normal diet the day after surgery. °5. Most patients will experience some swelling and bruising in the breast.  Ice packs and a good support bra will help.  Swelling and bruising can take several days to resolve.  °6. It is common to experience some constipation if taking pain medication after surgery.  Increasing fluid intake and taking a stool softener will usually help or prevent this problem from occurring.  A mild laxative (Milk of Magnesia or Miralax) should be taken according to package directions if there are no bowel movements after 48 hours. °7. Unless discharge instructions indicate otherwise, you may remove your bandages 24-48 hours after surgery, and you may shower at that time.  You may have steri-strips (small skin tapes) in place directly over the incision.  These strips should be left on the skin for 7-10 days.  If your surgeon used skin glue on the incision, you may shower in 24 hours.  The glue will flake off over the  next 2-3 weeks.  Any sutures or staples will be removed at the office during your follow-up visit. °8. ACTIVITIES:  You may resume regular daily activities (gradually increasing) beginning the next day.  Wearing a good support bra or sports bra minimizes pain and swelling.  You may have sexual intercourse when it is comfortable. °a. You may drive when you no longer are taking prescription pain medication, you can comfortably wear a seatbelt, and you can safely maneuver your car and apply brakes. °b. RETURN TO WORK:  ______________________________________________________________________________________ °9. You should see your doctor in the office for a follow-up appointment approximately two weeks after your surgery.  Your doctor’s nurse will typically make your follow-up appointment when she calls you with your pathology report.  Expect your pathology report 2-3 business days after your surgery.  You may call to check if you do not hear from us after three days. °10. OTHER INSTRUCTIONS: _______________________________________________________________________________________________ _____________________________________________________________________________________________________________________________________ °_____________________________________________________________________________________________________________________________________ °_____________________________________________________________________________________________________________________________________ ° °WHEN TO CALL YOUR DOCTOR: °1. Fever over 101.0 °2. Nausea and/or vomiting. °3. Extreme swelling or bruising. °4. Continued bleeding from incision. °5. Increased pain, redness, or drainage from the incision. ° °The clinic staff is available to answer your questions during regular business hours.  Please don’t hesitate to call and ask to speak to one of the nurses for clinical concerns.  If you have a medical emergency, go to the nearest  emergency room or call 911.  A surgeon from [facility] is always on call at the hospital. ° °For further questions, please visit [email or web address]  ° ° ° °  Post Anesthesia Home Care Instructions ° °Activity: °Get plenty of rest for the remainder of the day. A responsible adult should stay with you for 24 hours following the procedure.  °For the next 24 hours, DO NOT: °-Drive a car °-Operate machinery °-Drink alcoholic beverages °-Take any medication unless instructed by your physician °-Make any legal decisions or sign important papers. ° °Meals: °Start with liquid foods such as gelatin or soup. Progress to regular foods as tolerated. Avoid greasy, spicy, heavy foods. If nausea and/or vomiting occur, drink only clear liquids until the nausea and/or vomiting subsides. Call your physician if vomiting continues. ° °Special Instructions/Symptoms: °Your throat may feel dry or sore from the anesthesia or the breathing tube placed in your throat during surgery. If this causes discomfort, gargle with warm salt water. The discomfort should disappear within 24 hours. ° °If you had a scopolamine patch placed behind your ear for the management of post- operative nausea and/or vomiting: ° °1. The medication in the patch is effective for 72 hours, after which it should be removed.  Wrap patch in a tissue and discard in the trash. Wash hands thoroughly with soap and water. °2. You may remove the patch earlier than 72 hours if you experience unpleasant side effects which may include dry mouth, dizziness or visual disturbances. °3. Avoid touching the patch. Wash your hands with soap and water after contact with the patch. °  ° °

## 2016-01-31 NOTE — Anesthesia Procedure Notes (Signed)
Performed by: Senay Sistrunk C       

## 2016-01-31 NOTE — Op Note (Signed)
Preoperative diagnosis: Left breast DCIS right breast LCIS  Postoperative diagnosis: Same  Procedure: Bilateral needle localized partial mastectomy  Surgeon: Marcello Moores Reeda Soohoo  Anesthesia: Gen. with local metastatic  EBL: Minimal  Specimen right breast tissue between 2 wires 2 clips to pathology left breast tissue with clip and wire to pathology  Indications for procedure: Patient is a 61 year old female found to have DCIS arising from a papilloma on her left injected by mammography. She also had an area of distortion right breast core biopsy twice to show LCIS. Excision was recommended to both the. We discussed lumpectomy as well as mastectomy. We discussed reconstruction. Risks benefits and alternatives to surgery were discussed.The procedure has been discussed with the patient. Alternatives to surgery have been discussed with the patient.  Risks of surgery include bleeding,  Infection,  Seroma formation, death, cosmetic deformity   and the need for further surgery.   The patient understands and wishes to proceed.  Description of procedure: The patient was met in the holding area. Questions were answered. She underwent wire localization of breast. This was done by radiology. Brownback the operating room and placed supine. After induction of general anesthesia, both breast were prepped and draped in a sterile fashion. Timeout was done. Right breast was done first. There is wires superior portion the right breast. Transverse incision made between both wires. All tissue around both wires excised in its entirety. Radiograph revealed the wire and clip to be in specimen. This is oriented and sent to pathology. In a similar fashion the left breast was addressed. The wire inserted just above the nipple complex. Curvilinear incision was made along the superior border of the nipple areolar complex and all tissue around the wire was excised. Both the wire and clip in the specimen. Cavities were irrigated and  closed with 3-0 Vicryl and 4-0 Monocryl. Liquid adhesive applied. All final counts sponge, needles and this was found to be correct. The patient was awoke, extubated taken to recovery in satisfactory condition breast binder placed.

## 2016-02-01 ENCOUNTER — Encounter (HOSPITAL_BASED_OUTPATIENT_CLINIC_OR_DEPARTMENT_OTHER): Payer: Self-pay | Admitting: Surgery

## 2016-03-03 ENCOUNTER — Ambulatory Visit
Admission: RE | Admit: 2016-03-03 | Discharge: 2016-03-03 | Disposition: A | Discharge status: Home / Self Care | Payer: 59 | Source: Ambulatory Visit | Attending: Radiation Oncology | Admitting: Radiation Oncology

## 2016-03-03 ENCOUNTER — Encounter: Payer: Self-pay | Admitting: Radiation Oncology

## 2016-03-03 VITALS — BP 134/94 | HR 93 | Temp 99.0°F | Resp 16 | Wt 189.4 lb

## 2016-03-03 DIAGNOSIS — K219 Gastro-esophageal reflux disease without esophagitis: Secondary | ICD-10-CM | POA: Insufficient documentation

## 2016-03-03 DIAGNOSIS — Z9889 Other specified postprocedural states: Secondary | ICD-10-CM | POA: Diagnosis not present

## 2016-03-03 DIAGNOSIS — Z79899 Other long term (current) drug therapy: Secondary | ICD-10-CM | POA: Diagnosis not present

## 2016-03-03 DIAGNOSIS — Z809 Family history of malignant neoplasm, unspecified: Secondary | ICD-10-CM | POA: Insufficient documentation

## 2016-03-03 DIAGNOSIS — Z833 Family history of diabetes mellitus: Secondary | ICD-10-CM | POA: Diagnosis not present

## 2016-03-03 DIAGNOSIS — D0512 Intraductal carcinoma in situ of left breast: Secondary | ICD-10-CM

## 2016-03-03 DIAGNOSIS — Z8349 Family history of other endocrine, nutritional and metabolic diseases: Secondary | ICD-10-CM | POA: Insufficient documentation

## 2016-03-03 DIAGNOSIS — D0501 Lobular carcinoma in situ of right breast: Secondary | ICD-10-CM | POA: Diagnosis not present

## 2016-03-03 DIAGNOSIS — Z888 Allergy status to other drugs, medicaments and biological substances status: Secondary | ICD-10-CM | POA: Diagnosis not present

## 2016-03-03 DIAGNOSIS — Z8249 Family history of ischemic heart disease and other diseases of the circulatory system: Secondary | ICD-10-CM | POA: Diagnosis not present

## 2016-03-03 DIAGNOSIS — Z170 Estrogen receptor positive status [ER+]: Secondary | ICD-10-CM | POA: Diagnosis not present

## 2016-03-03 DIAGNOSIS — I872 Venous insufficiency (chronic) (peripheral): Secondary | ICD-10-CM | POA: Diagnosis not present

## 2016-03-03 DIAGNOSIS — C50112 Malignant neoplasm of central portion of left female breast: Secondary | ICD-10-CM

## 2016-03-03 DIAGNOSIS — E039 Hypothyroidism, unspecified: Secondary | ICD-10-CM | POA: Diagnosis not present

## 2016-03-03 NOTE — Progress Notes (Signed)
Location of Breast Cancer: Right  Breast 12 o'clock position, Left Breast Subareolar  Histology per Pathology Report:   Diagnosis 10/24/2014:Dr. Erroll Luna, MD 1. 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 2. Breast, right, needle core biopsy, 12 o'clock - LOBULAR NEOPLASIA (LOBULAR CARCINOMA IN SITU)  Receptor Status: ER(100%+), PR (100%+), Her2-neu (   ), Ki-(   )  Did patient present with symptoms (if so, please note symptoms) or was this found on screening mammography?: Routine screening  Past/Anticipated interventions by surgeon, if any: to be scheduled with Dr. Cornett,.MD, Diagnosis 01/31/2016: 1. 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 - SCATTERED ASSOCIATED MICROCALCIFICATIONS. - SEE COMMENT. 2. Breast, lumpectomy, Left - LOW GRADE DUCTAL CARCINOMA IN SITU WITH ASSOCIATED CALCIFICATIONS PARTIALLY INVOLVING A COMPLEX SCLEROSING LESION. - FOCAL ATYPICAL LOBULAR HYPERPLASIA. - DUCTAL CARCINOMA IN SITU IS FOCALLY 0.3 CM AWAY FROM ANTERIOR MARGIN BUT INKED MARGIN SURFACE IS NEGATIVE. - OTHER MARGINS ARE NEGATIVE.  Past/Anticipated interventions by medical oncology, if any: currently taking Tamoxifen  Lymphedema issues, if any: no  Pain issues, if any:  no  SAFETY ISSUES:  Prior radiation? No  Pacemaker/ICD? NO  Possible current pregnancy? NO  Is the patient on methotrexate? NO  Current Complaints / other details:  Menarche age 18, G83P1,  Mother Breast Cancer,HTN,Colon Polyps,Daughter seizure disorder  BP (!) 134/94 (BP Location: Right Arm, Patient Position: Sitting, Cuff Size: Normal)   Pulse 93   Temp 99 F (37.2 C) (Oral)   Resp 16   Wt 189 lb 6.4 oz (85.9 kg)   BMI 31.52 kg/m    Wt Readings from Last 3 Encounters:  03/03/16 189 lb 6.4 oz (85.9 kg)   01/31/16 185 lb (83.9 kg)  01/03/16 192 lb 1.6 oz (87.1 kg)

## 2016-03-03 NOTE — Progress Notes (Signed)
Please see the Nurse Progress Note in the MD Initial Consult Encounter for this patient. 

## 2016-03-03 NOTE — Progress Notes (Signed)
Radiation Oncology         [phone] ________________________________  Name: Jennifer Rodgers MRN: [id number]  Date: 03/03/2016  DOB: 1955/03/20  Follow-Up Visit Note  CC: Tivis Ringer, MD  Magrinat, Virgie Dad, MD    ICD-9-CM ICD-10-CM   1. 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 The patient was initially met in consultation on 11/05/15. She has since undergone bilateral lumpectomy on 01/31/16 with Dr. Brantley Stage.        On review of systems, the patient denies lymphedema or pain at this time. She reports she never needed to use pain medicine following surgery. She has an upcoming appointment with Dr. Jana Hakim on 03/19/16. The patient is currently taking Tamoxifen.  Prior Radiotherapy: No  ALLERGIES:  is allergic to prednisone.  Meds: Current Outpatient Prescriptions  Medication Sig Dispense Refill  . Omega-3 Fatty Acids (FISH OIL CONCENTRATE PO) Take 2 capsules by mouth daily.    . pantoprazole (PROTONIX) 40 MG tablet Take 40 mg by mouth daily.    Marland Kitchen SYNTHROID 100 MCG tablet Take 1 tablet by mouth daily.    . tamoxifen (NOLVADEX) 10 MG tablet Take 10 mg by mouth 2 (two) times daily.    . vitamin B-12 (CYANOCOBALAMIN) 1000 MCG tablet Take 1,000 mcg by mouth 2 (two) times daily.    . vitamin C (ASCORBIC ACID) 250 MG tablet Take 250 mg by mouth 2 (two) times daily.    Marland Kitchen VITAMIN D, CHOLECALCIFEROL, PO Take 1,000 Units by mouth daily.     Marland Kitchen conjugated estrogens (PREMARIN) vaginal cream Place 1 Applicatorful vaginally daily. (Patient not taking: Reported on 03/03/2016) 42.5 g 12   No current facility-administered medications for this encounter.     Physical Findings: The patient is in no acute distress.  Patient is alert and oriented.  weight is 189 lb 6.4 oz (85.9 kg). Her oral temperature is 99 F (37.2 C). Her blood pressure is 134/94 (abnormal) and her pulse is 93. Her respiration is 16.  No significant changes. Heart is regular in rate and rhythm. Lungs are clear to auscultation bilaterally. No cervical, supraclavicular, or axillary adenopathy appreciated. Examination of the breast reveals left breast scar along on superior areolar border. This is healing well without signs of drainage or infection, with no dominant mass noted. In the right breast she has a well healed scar in the upper inner quadrant without signs of drainage or infection, with no dominant mass appreciated.  Lab Findings: Lab Results  Component Value Date   WBC 5.3 01/28/2016   HGB 11.8 (L) 01/28/2016   HCT 36.0 01/28/2016   MCV 87.4 01/28/2016   PLT 272 01/28/2016    Radiographic Findings: No results found.  Impression:  The patient is recovering well from bilateral lumpectomy performed 01/31/16. Surgery from the right breast revealed lobular carcinoma in situ with no indication for radiation therapy. Left breast lumpectomy showed low-grade ductal carcinoma in situ associated with calcifications. This lesion measured 1.3 cm in greatest dimension. The closest margin was 0.3 cm that being the anterior margin. Patient does have a low risk for recurrence given this overall picture but given her relatively young age would still recommend radiation therapy as part  of her overall management.  Plan: We discussed the risks, benefits, and side effects of radiation therapy. We discussed the logistics of radiation for the patient's education. The patient was encouraged to ask questions which I answered to the best of my ability. She is interested in proceeding with radiotherapy. A consent form was discussed, signed, and placed in the patient's chart. She is scheduled for CT Simulation and treatment planning on 03/10/16 at 3 pm, and will  begin treatment 1 week later.  The patient may be a candidate for hyperfractionated accelerated course of radiation therapy but will need to make this determination at time of her simulation and planning.  -----------------------------------  Blair Promise, PhD, MD  This document serves as a record of services personally performed by Gery Pray, MD. It was created on his behalf by Maryla Morrow, a trained medical scribe. The creation of this record is based on the scribe's personal observations and the provider's statements to them. This document has been checked and approved by the attending provider.

## 2016-03-07 DIAGNOSIS — L821 Other seborrheic keratosis: Secondary | ICD-10-CM | POA: Diagnosis not present

## 2016-03-07 DIAGNOSIS — X32XXXD Exposure to sunlight, subsequent encounter: Secondary | ICD-10-CM | POA: Diagnosis not present

## 2016-03-07 DIAGNOSIS — L570 Actinic keratosis: Secondary | ICD-10-CM | POA: Diagnosis not present

## 2016-03-10 ENCOUNTER — Ambulatory Visit
Admission: RE | Admit: 2016-03-10 | Discharge: 2016-03-10 | Disposition: A | Discharge status: Home / Self Care | Payer: 59 | Source: Ambulatory Visit | Attending: Radiation Oncology | Admitting: Radiation Oncology

## 2016-03-10 DIAGNOSIS — Z0000 Encounter for general adult medical examination without abnormal findings: Secondary | ICD-10-CM | POA: Diagnosis not present

## 2016-03-10 DIAGNOSIS — C50112 Malignant neoplasm of central portion of left female breast: Secondary | ICD-10-CM | POA: Diagnosis not present

## 2016-03-10 DIAGNOSIS — D0512 Intraductal carcinoma in situ of left breast: Secondary | ICD-10-CM

## 2016-03-10 DIAGNOSIS — E038 Other specified hypothyroidism: Secondary | ICD-10-CM | POA: Diagnosis not present

## 2016-03-10 NOTE — Progress Notes (Signed)
  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  Identity was confirmed.  All relevant records and images related to the planned course of therapy were reviewed.  The patient freely provided informed written consent to proceed with treatment after reviewing the details related to the planned course of therapy. The consent form was witnessed and verified by the simulation staff.  Then, the patient was set-up in a stable reproducible  supine position for radiation therapy.  CT images were obtained.  Surface markings were placed.  The CT images were loaded into the planning software.  Then the target and avoidance structures were contoured.  Treatment planning then occurred.  The radiation prescription was entered and confirmed.  Then, I designed and supervised the construction of a total of 3 medically necessary complex treatment devices.  I have requested : 3D Simulation  I have requested a DVH of the following structures: heart, lungs, lumpectomy cavity.  I have ordered:CBC  PLAN:  The patient will receive 50.4 Gy in 28 fractions followed by a boost to the lumpectomy cavity of 10 gray for a cumulative dose of 60.4 gray. If technically possible after planning,  the patient will proceed with hypo-fractionated accelerated radiation therapy.  -----------------------------------  Blair Promise, PhD, MD  This document serves as a record of services personally performed by Gery Pray, MD. It was created on his behalf by Darcus Austin, a trained medical scribe. The creation of this record is based on the scribe's personal observations and the provider's statements to them.  This document has been checked and approved by the attending provider.

## 2016-03-13 DIAGNOSIS — D0512 Intraductal carcinoma in situ of left breast: Secondary | ICD-10-CM | POA: Diagnosis not present

## 2016-03-13 DIAGNOSIS — C50112 Malignant neoplasm of central portion of left female breast: Secondary | ICD-10-CM | POA: Diagnosis not present

## 2016-03-17 ENCOUNTER — Ambulatory Visit: Payer: 59 | Admitting: Radiation Oncology

## 2016-03-18 ENCOUNTER — Inpatient Hospital Stay
Admission: RE | Admit: 2016-03-18 | Discharge: 2016-03-18 | Disposition: A | Discharge status: Home / Self Care | Payer: Self-pay | Source: Ambulatory Visit | Attending: Radiation Oncology | Admitting: Radiation Oncology

## 2016-03-18 ENCOUNTER — Ambulatory Visit
Admission: RE | Admit: 2016-03-18 | Discharge: 2016-03-18 | Disposition: A | Discharge status: Home / Self Care | Payer: 59 | Source: Ambulatory Visit | Attending: Radiation Oncology | Admitting: Radiation Oncology

## 2016-03-18 ENCOUNTER — Encounter: Payer: Self-pay | Admitting: Radiation Oncology

## 2016-03-18 VITALS — BP 127/84 | HR 73 | Temp 98.1°F | Ht 65.0 in | Wt 192.2 lb

## 2016-03-18 DIAGNOSIS — D0501 Lobular carcinoma in situ of right breast: Secondary | ICD-10-CM

## 2016-03-18 DIAGNOSIS — D0512 Intraductal carcinoma in situ of left breast: Secondary | ICD-10-CM | POA: Diagnosis not present

## 2016-03-18 DIAGNOSIS — C50112 Malignant neoplasm of central portion of left female breast: Secondary | ICD-10-CM | POA: Diagnosis not present

## 2016-03-18 MED ORDER — RADIAPLEXRX EX GEL: Freq: Once | CUTANEOUS | Status: DC

## 2016-03-18 NOTE — Progress Notes (Signed)
Pt here for patient teaching.  Pt given Radiation and You booklet, skin care instructions and Radiaplex gel.  Reviewed areas of pertinence such as fatigue, skin changes, breast tenderness and breast swelling . Pt able to give teach back of to pat skin and use unscented/gentle soap,apply Radiaplex bid, avoid applying anything to skin within 4 hours of treatment and to use an electric razor if they must shave. Pt demonstrated understanding and verbalizes understanding of information given and will contact nursing with any questions or concerns.          

## 2016-03-18 NOTE — Progress Notes (Signed)
Jennifer Rodgers has completed 1 fraction to her left breast.  She does not have any complaints today.  BP 127/84 (BP Location: Right Arm, Patient Position: Sitting)   Pulse 73   Temp 98.1 F (36.7 C) (Oral)   Ht 5\' 5"  (1.651 m)   Wt 192 lb 3.2 oz (87.2 kg)   SpO2 99%   BMI 31.98 kg/m    Wt Readings from Last 3 Encounters:  03/18/16 192 lb 3.2 oz (87.2 kg)  03/03/16 189 lb 6.4 oz (85.9 kg)  01/31/16 185 lb (83.9 kg)

## 2016-03-18 NOTE — Progress Notes (Signed)
  Radiation Oncology         [phone] ________________________________  Name: Jennifer Rodgers MRN: [id number]  Date: 03/18/2016  DOB: 14-Sep-1955  Simulation Verification Note    ICD-9-CM ICD-10-CM   1. Ductal carcinoma in situ (DCIS) of left breast 233.0 D05.12     Status: outpatient  NARRATIVE: The patient was brought to the treatment unit and placed in the planned treatment position. The clinical setup was verified. Then port films were obtained and uploaded to the radiation oncology medical record software.  The treatment beams were carefully compared against the planned radiation fields. The position location and shape of the radiation fields was reviewed. They targeted volume of tissue appears to be appropriately covered by the radiation beams. Organs at risk appear to be excluded as planned.  Based on my personal review, I approved the simulation verification. The patient's treatment will proceed as planned.  -----------------------------------  Blair Promise, PhD, MD

## 2016-03-18 NOTE — Progress Notes (Signed)
  Radiation Oncology         [phone] ________________________________  Name: Jennifer Rodgers MRN: [id number]  Date: 03/18/2016  DOB: 04-Feb-1955  Weekly Radiation Therapy Management    ICD-9-CM ICD-10-CM   1. Ductal carcinoma in situ (DCIS) of left breast 233.0 D05.12      Current Dose: 2.67 Gy     Planned Dose:  52.72 Gy  Narrative . . . . . . . . The patient presents for routine under treatment assessment.                                    The patient completed 1 fraction to the left breast. She has no complaints at this time.                                  Set-up films were reviewed.                                 The chart was checked. Physical Findings. . .  height is 5\' 5"  (1.651 m) and weight is 192 lb 3.2 oz (87.2 kg). Her oral temperature is 98.1 F (36.7 C). Her blood pressure is 127/84 and her pulse is 73. Her oxygen saturation is 99%. . Weight essentially stable. Lungs are clear to auscultation bilaterally. Heart has regular rate and rhythm. Impression . . . . . . . The patient is tolerating radiation. Plan . . . . . . . . . . . . Continue treatment as planned.  ________________________________   Blair Promise, PhD, MD  This document serves as a record of services personally performed by Gery Pray, MD. It was created on his behalf by Jennifer Rodgers, a trained medical scribe. The creation of this record is based on the scribe's personal observations and the provider's statements to them. This document has been checked and approved by the attending provider.

## 2016-03-19 ENCOUNTER — Ambulatory Visit (HOSPITAL_BASED_OUTPATIENT_CLINIC_OR_DEPARTMENT_OTHER): Payer: 59 | Admitting: Oncology

## 2016-03-19 ENCOUNTER — Ambulatory Visit
Admission: RE | Admit: 2016-03-19 | Discharge: 2016-03-19 | Disposition: A | Discharge status: Home / Self Care | Payer: 59 | Source: Ambulatory Visit | Attending: Radiation Oncology | Admitting: Radiation Oncology

## 2016-03-19 VITALS — BP 125/78 | HR 81 | Temp 97.7°F | Resp 18 | Ht 65.0 in | Wt 190.8 lb

## 2016-03-19 DIAGNOSIS — N898 Other specified noninflammatory disorders of vagina: Secondary | ICD-10-CM

## 2016-03-19 DIAGNOSIS — D0512 Intraductal carcinoma in situ of left breast: Secondary | ICD-10-CM

## 2016-03-19 DIAGNOSIS — Z1389 Encounter for screening for other disorder: Secondary | ICD-10-CM | POA: Diagnosis not present

## 2016-03-19 DIAGNOSIS — Z0000 Encounter for general adult medical examination without abnormal findings: Secondary | ICD-10-CM | POA: Diagnosis not present

## 2016-03-19 DIAGNOSIS — I872 Venous insufficiency (chronic) (peripheral): Secondary | ICD-10-CM | POA: Diagnosis not present

## 2016-03-19 DIAGNOSIS — D0501 Lobular carcinoma in situ of right breast: Secondary | ICD-10-CM

## 2016-03-19 DIAGNOSIS — C50919 Malignant neoplasm of unspecified site of unspecified female breast: Secondary | ICD-10-CM | POA: Diagnosis not present

## 2016-03-19 DIAGNOSIS — R7301 Impaired fasting glucose: Secondary | ICD-10-CM | POA: Diagnosis not present

## 2016-03-19 DIAGNOSIS — C50112 Malignant neoplasm of central portion of left female breast: Secondary | ICD-10-CM | POA: Diagnosis not present

## 2016-03-19 MED ORDER — TAMOXIFEN CITRATE 10 MG PO TABS: 10.0000 mg | ORAL_TABLET | Freq: Two times a day (BID) | ORAL | 4 refills | Status: DC

## 2016-03-19 MED ORDER — RADIAPLEXRX EX GEL
Freq: Once | CUTANEOUS | Status: AC
  Administered 2016-03-19: 08:00:00 via TOPICAL

## 2016-03-19 NOTE — Progress Notes (Signed)
[facility]  Telephone:(336) [phone] Fax:(336) [phone]     ID: LATESHIA TOMLINSON DOB: 03/19/1955  MR#: [id number]  [id number]  Patient Care Team: [name], MD as PCP - General (Internal Medicine) Chauncey Cruel, MD as Consulting Physician (Oncology) Erroll Luna, MD as Consulting Physician (General Surgery) Richmond Campbell, MD as Consulting Physician (Gastroenterology) Chauncey Cruel, MD OTHER MD:  CHIEF COMPLAINT: Ductal carcinoma in situ, LCIS  CURRENT TREATMENT: Receiving adjuvant radiation; tamoxifen   BREAST CANCER HISTORY: From the original intake note:  Britiny had routine screening mammography in September 2017 suggesting possible areas of distortion in both breasts. On 10/22/2015 she underwent bilateral diagnostic mammography with tomography at the [facility]. The Breast density was category C.  In the right breast there was an area of persistent distortion at the 12:00 region. There was also an area of distortion in the subareolar region of the left breast.  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  The patient's subsequent history is as detailed below   INTERVAL HISTORY: Lynnea returns today for follow-up of her left-sided ductal carcinoma in situ area since her last visit here she underwent bilateral lumpectomies, on 01/31/2016. The final pathology ([id number]) showed, on the right, only lobular carcinoma in situ. On the left there was ductal carcinoma in situ, low-grade, with negative margins.  Her case was presented at the multidisciplinary breast cancer conference for every 20 09/08/2016. It was felt she would benefit from adjuvant radiation and 5 years of anti-estrogens.  Her adjuvant radiation actually started  yesterday, 04/15/2016. She felt tired after the first dose, which is unusual and may have been unrelated.  She continues on tamoxifen. She has no hot flashes from this but does have significant vaginal wetness and has to wear a pad because of it.  REVIEW OF SYSTEMS: Rinnah is trying to get back to a walking program usually at lunch. She tells me she gets between 5 and 6000 steps most days and occasionally has a 10,000 steps day. Aside from these issues a detailed review of systems today was noncontributory  PAST MEDICAL HISTORY: Past Medical History:  Diagnosis Date  . Abdominal pain, unspecified site   . Acute pharyngitis   . Acute upper respiratory infections of unspecified site   . Allergic rhinitis, cause unspecified   . Breast cancer ([place]) 10/24/2015   Left breast   . Degeneration of cervical intervertebral disc   . Diaphragmatic hernia without mention of obstruction or gangrene   . Dysuria   . Esophageal reflux   . Migraine without aura, without mention of intractable migraine without mention of status migrainosus   . Nausea alone   . Other constipation   . Other malaise and fatigue   . PONV (postoperative nausea and vomiting)   . Screening for depression   . Unspecified hypothyroidism   . Unspecified venous (peripheral) insufficiency   . Urinary frequency     PAST SURGICAL HISTORY: Past Surgical History:  Procedure Laterality Date  . BREAST LUMPECTOMY WITH NEEDLE LOCALIZATION Bilateral 01/31/2016   Procedure: RIGHT BREAST LUMPECTOMY WITH DOUBLE NEEDLE LOCALIZATION, LEFT BREAST NEEDLE LOCALIZED LUMPECTOMY;  Surgeon: Erroll Luna, MD;  Location: [facility];  Service: General;  Laterality: Bilateral;  . COLONOSCOPY  03/03/2011  . LUMBAR LAMINECTOMY  2005  . SALPINGOOPHORECTOMY  2004  .  SHOULDER ARTHROSCOPY Left 2008  . THYROID SURGERY  2006   resection of L nodule    FAMILY HISTORY Family History  Problem Relation Age of Onset  . Hypertension Mother     . Thyroid disease Mother   . Hyperlipidemia Mother   . Cancer Mother     breast  . Hypertension Sister   . Hyperlipidemia Sister   . Diabetes Sister   . Hypertension Daughter   . Hyperlipidemia Daughter   The patient has no information regarding her father or his side of the family. The patient's mother is alive at age 64. She developed breast cancer in her early 7s. She has also been found to have multiple polyps. She has not had genetics testing. The patient has no brothers. She has 3 half-sisters. There is no history of breast or ovarian cancer in the family otherwise than as noted  GYNECOLOGIC HISTORY:  No LMP recorded. Patient has had a hysterectomy. Menarche age 68, first live birth age 87, the patient is GX P1. She underwent TAH-BSO in 2004. She has been on hormone replacement with estrogen until 2017.  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 Daughter [name] lives in [place] where she works in Insurance underwriter out of her home. The patient has 2 grandchildren and 2 great-grandchildren. She is not a Ambulance person.    ADVANCED DIRECTIVES: Not in place   HEALTH MAINTENANCE: Social History  Substance Use Topics  . Smoking status: Never Smoker  . Smokeless tobacco: Never Used  . Alcohol use No     Colonoscopy: April 2018/[facility]  PAP: Status post hysterectomy  Bone density: Never   Allergies  Allergen Reactions  . Prednisone Other (See Comments)    Extreme facial flushing and confusion    Current Outpatient Prescriptions  Medication Sig Dispense Refill  . hyaluronate sodium (RADIAPLEXRX) GEL Apply 1 application topically 2 (two) times daily.    . Omega-3 Fatty Acids (FISH OIL CONCENTRATE PO) Take 2 capsules by mouth daily.    . pantoprazole (PROTONIX) 40 MG tablet Take 40 mg by mouth daily.    Marland Kitchen SYNTHROID 100 MCG tablet Take 1 tablet by mouth daily.    . tamoxifen (NOLVADEX)  10 MG tablet Take 10 mg by mouth 2 (two) times daily.    . vitamin B-12 (CYANOCOBALAMIN) 1000 MCG tablet Take 1,000 mcg by mouth 2 (two) times daily.    . vitamin C (ASCORBIC ACID) 250 MG tablet Take 250 mg by mouth 2 (two) times daily.    Marland Kitchen VITAMIN D, CHOLECALCIFEROL, PO Take 1,000 Units by mouth daily.      No current facility-administered medications for this visit.     OBJECTIVE: Middle-aged white woman  In no acute distress  Vitals:   03/19/16 0941  BP: 125/78  Pulse: 81  Resp: 18  Temp: 97.7 F (36.5 C)     Body mass index is 31.75 kg/m.    ECOG FS:0 - Asymptomatic  Sclerae unicteric, pupils round and equal Oropharynx clear and moist-- no thrush or other lesions No cervical or supraclavicular adenopathy Lungs no rales or rhonchi Heart regular rate and rhythm Abd soft, nontender, positive bowel sounds MSK no focal spinal tenderness, no upper extremity lymphedema Neuro: nonfocal, well oriented, appropriate affect Breasts: The incision on the right is across the top of the breast. There is minimal indentation of the contour. On the left the incision is periareolar.  In both cases the cosmetic result is good to excellent. There is no palpable mass, no dehiscence, no erythema. Both axillae are benign.  LAB RESULTS:  CMP     Component Value Date/Time   NA 140 01/28/2016 0810   NA 142 01/03/2016 1341   K 4.2 01/28/2016 0810   K 3.8 01/03/2016 1341   CL 105 01/28/2016 0810   CO2 28 01/28/2016 0810   CO2 25 01/03/2016 1341   GLUCOSE 100 (H) 01/28/2016 0810   GLUCOSE 103 01/03/2016 1341   BUN 18 01/28/2016 0810   BUN 13.4 01/03/2016 1341   CREATININE 0.97 01/28/2016 0810   CREATININE 0.8 01/03/2016 1341   CALCIUM 9.1 01/28/2016 0810   CALCIUM 9.2 01/03/2016 1341   PROT 6.5 01/28/2016 0810   PROT 6.7 01/03/2016 1341   ALBUMIN 3.6 01/28/2016 0810   ALBUMIN 3.6 01/03/2016 1341   AST 14 (L) 01/28/2016 0810   AST 13 01/03/2016 1341   ALT 15 01/28/2016 0810   ALT 16  01/03/2016 1341   ALKPHOS 49 01/28/2016 0810   ALKPHOS 63 01/03/2016 1341   BILITOT 0.4 01/28/2016 0810   BILITOT 0.31 01/03/2016 1341   GFRNONAA >60 01/28/2016 0810   GFRAA >60 01/28/2016 0810    INo results found for: SPEP, UPEP  Lab Results  Component Value Date   WBC 5.3 01/28/2016   NEUTROABS 2.9 01/28/2016   HGB 11.8 (L) 01/28/2016   HCT 36.0 01/28/2016   MCV 87.4 01/28/2016   PLT 272 01/28/2016      Chemistry      Component Value Date/Time   NA 140 01/28/2016 0810   NA 142 01/03/2016 1341   K 4.2 01/28/2016 0810   K 3.8 01/03/2016 1341   CL 105 01/28/2016 0810   CO2 28 01/28/2016 0810   CO2 25 01/03/2016 1341   BUN 18 01/28/2016 0810   BUN 13.4 01/03/2016 1341   CREATININE 0.97 01/28/2016 0810   CREATININE 0.8 01/03/2016 1341      Component Value Date/Time   CALCIUM 9.1 01/28/2016 0810   CALCIUM 9.2 01/03/2016 1341   ALKPHOS 49 01/28/2016 0810   ALKPHOS 63 01/03/2016 1341   AST 14 (L) 01/28/2016 0810   AST 13 01/03/2016 1341   ALT 15 01/28/2016 0810   ALT 16 01/03/2016 1341   BILITOT 0.4 01/28/2016 0810   BILITOT 0.31 01/03/2016 1341       No results found for: LABCA2  No components found for: LABCA125  No results for input(s): INR in the last 168 hours.  Urinalysis No results found for: COLORURINE, APPEARANCEUR, LABSPEC, PHURINE, GLUCOSEU, HGBUR, BILIRUBINUR, KETONESUR, PROTEINUR, UROBILINOGEN, NITRITE, LEUKOCYTESUR   STUDIES: No results found.  ELIGIBLE FOR AVAILABLE RESEARCH PROTOCOL: COMET  ASSESSMENT: 61 y.o. status post left breast biopsy 10/24/2015 showing ductal carcinoma in situ, low-grade, estrogen and progesterone receptor positive, involving a complex sclerosing lesion, and LCIS  (a) right sided LCIS also biopsied 11/23/2015  (1) the patient considered the COMET trial but is ineligible because of contralateral LCIS  (2) breast MRI 11/15/2015 showed 2 additional masses in the right breast not seen by prior studies  (a) biopsy  of an upper inner quadrant right breast lesion showed lobular carcinoma in situ.  (b) the additional right breast lesion noted in 11/15/2015 was not apparent on the 11/23/2015 MRI  (3) Status post bilateral lumpectomies 01/31/2016, showing  (a) on the right, lobular carcinoma in situ  (b) on the left, ductal carcinoma in situ, low-grade, with negative margins  (  4) adjuvant radiation In process  (5) tamoxifen started November 2017, to be continued for total of 5 years  PLAN: Jennifer Rodgers is tolerating her radiation well so far. She understands this does cause fatigue and that the fatigue does not resolve right after she finishes radiation. Usually takes a couple of extra months to get over it. The best way to minimize that problem is to continue to exercise and that is what she is doing.  She is tolerating the tamoxifen well except for problems with vaginal discharge. We could switch to an aromatase inhibitor but then she would experience the opposite, namely significant vaginal dryness problems. At this point she is willing to continue tamoxifen for 5 years plan  She is at significant risk of developing another breast cancer and warrants an MRI yearly in addition to yearly mammography. If she gets her mammogram as usual in the fall, I will probably set her up for an MRI in December and then we can continue to follow her closely for the next 5 years  She knows to call for any problems that may develop before her return visit Chauncey Cruel, MD   03/19/2016 9:47 AM Medical Oncology and Hematology [facility] [place], [place] [id number]

## 2016-03-20 ENCOUNTER — Ambulatory Visit
Admission: RE | Admit: 2016-03-20 | Discharge: 2016-03-20 | Disposition: A | Discharge status: Home / Self Care | Payer: 59 | Source: Ambulatory Visit | Attending: Radiation Oncology | Admitting: Radiation Oncology

## 2016-03-20 DIAGNOSIS — D0512 Intraductal carcinoma in situ of left breast: Secondary | ICD-10-CM | POA: Diagnosis not present

## 2016-03-20 DIAGNOSIS — C50112 Malignant neoplasm of central portion of left female breast: Secondary | ICD-10-CM | POA: Diagnosis not present

## 2016-03-21 ENCOUNTER — Ambulatory Visit
Admission: RE | Admit: 2016-03-21 | Discharge: 2016-03-21 | Disposition: A | Discharge status: Home / Self Care | Payer: 59 | Source: Ambulatory Visit | Attending: Radiation Oncology | Admitting: Radiation Oncology

## 2016-03-21 ENCOUNTER — Other Ambulatory Visit: Payer: Self-pay | Admitting: Emergency Medicine

## 2016-03-21 DIAGNOSIS — C50112 Malignant neoplasm of central portion of left female breast: Secondary | ICD-10-CM | POA: Diagnosis not present

## 2016-03-21 DIAGNOSIS — C50919 Malignant neoplasm of unspecified site of unspecified female breast: Secondary | ICD-10-CM | POA: Diagnosis not present

## 2016-03-21 DIAGNOSIS — D0512 Intraductal carcinoma in situ of left breast: Secondary | ICD-10-CM | POA: Diagnosis not present

## 2016-03-21 DIAGNOSIS — I872 Venous insufficiency (chronic) (peripheral): Secondary | ICD-10-CM | POA: Diagnosis not present

## 2016-03-21 DIAGNOSIS — Z1389 Encounter for screening for other disorder: Secondary | ICD-10-CM | POA: Diagnosis not present

## 2016-03-21 DIAGNOSIS — Z0000 Encounter for general adult medical examination without abnormal findings: Secondary | ICD-10-CM | POA: Diagnosis not present

## 2016-03-21 DIAGNOSIS — R7301 Impaired fasting glucose: Secondary | ICD-10-CM | POA: Diagnosis not present

## 2016-03-21 MED ORDER — TAMOXIFEN CITRATE 20 MG PO TABS: 20.0000 mg | ORAL_TABLET | Freq: Every day | ORAL | 4 refills | Status: DC

## 2016-03-24 ENCOUNTER — Ambulatory Visit
Admission: RE | Admit: 2016-03-24 | Discharge: 2016-03-24 | Disposition: A | Discharge status: Home / Self Care | Payer: 59 | Source: Ambulatory Visit | Attending: Radiation Oncology | Admitting: Radiation Oncology

## 2016-03-24 DIAGNOSIS — D0512 Intraductal carcinoma in situ of left breast: Secondary | ICD-10-CM | POA: Diagnosis not present

## 2016-03-24 DIAGNOSIS — C50112 Malignant neoplasm of central portion of left female breast: Secondary | ICD-10-CM | POA: Diagnosis not present

## 2016-03-25 ENCOUNTER — Encounter: Payer: Self-pay | Admitting: Radiation Oncology

## 2016-03-25 ENCOUNTER — Ambulatory Visit
Admission: RE | Admit: 2016-03-25 | Discharge: 2016-03-25 | Disposition: A | Discharge status: Home / Self Care | Payer: 59 | Source: Ambulatory Visit | Attending: Radiation Oncology | Admitting: Radiation Oncology

## 2016-03-25 VITALS — BP 125/87 | HR 75 | Temp 97.8°F | Resp 20 | Wt 193.0 lb

## 2016-03-25 DIAGNOSIS — D0512 Intraductal carcinoma in situ of left breast: Secondary | ICD-10-CM

## 2016-03-25 DIAGNOSIS — C50112 Malignant neoplasm of central portion of left female breast: Secondary | ICD-10-CM | POA: Diagnosis not present

## 2016-03-25 NOTE — Progress Notes (Signed)
Weekly rad txs left breast 6/21 completed,  Mild erythema ,skin intact, no c/o pain, using radiaplex bid, appetite good,  9:57 AM .BP 125/87 (BP Location: Right Arm, Patient Position: Sitting, Cuff Size: Normal)   Pulse 75   Temp 97.8 F (36.6 C) (Oral)   Resp 20   Wt 193 lb (87.5 kg)   BMI 32.12 kg/m   Wt Readings from Last 3 Encounters:  03/25/16 193 lb (87.5 kg)  03/19/16 190 lb 12.8 oz (86.5 kg)  03/18/16 192 lb 3.2 oz (87.2 kg)

## 2016-03-25 NOTE — Progress Notes (Signed)
  Radiation Oncology         (336) [phone] ________________________________  Name: Jennifer Rodgers MRN: [id number]  Date: 03/25/2016  DOB: 02/07/1955  Weekly Radiation Therapy Management    ICD-9-CM ICD-10-CM   1. 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 . . . . . . . The patient presents for routine under treatment assessment.                                    6/21 treatments to the left breast completed. Mild erythema. Skin intact. Denies plan or any issues at this time. Using radiaplex BID. Has a good appetite.                                  Set-up films were reviewed.                                 The chart was checked. Physical Findings. . .  weight is 193 lb (87.5 kg). Her oral temperature is 97.8 F (36.6 C). Her blood pressure is 125/87 and her pulse is 75. Her respiration is 20. . Weight essentially stable. Lungs are clear to auscultation bilaterally. Heart has regular rate and rhythm. Mild erythema of the left breast. Impression . . . . . . . The patient is tolerating radiation. Plan . . . . . . . . . . . . Continue treatment as planned.  ________________________________   Blair Promise, PhD, MD  This document serves as a record of services personally performed by Gery Pray, MD. It was created on his behalf by Darcus Austin, a trained medical scribe. The creation of this record is based on the scribe's personal observations and the provider's statements to them. This document has been checked and approved by the attending provider.

## 2016-03-26 ENCOUNTER — Ambulatory Visit
Admission: RE | Admit: 2016-03-26 | Discharge: 2016-03-26 | Disposition: A | Discharge status: Home / Self Care | Payer: 59 | Source: Ambulatory Visit | Attending: Radiation Oncology | Admitting: Radiation Oncology

## 2016-03-26 DIAGNOSIS — C50112 Malignant neoplasm of central portion of left female breast: Secondary | ICD-10-CM | POA: Diagnosis not present

## 2016-03-26 DIAGNOSIS — D0512 Intraductal carcinoma in situ of left breast: Secondary | ICD-10-CM | POA: Diagnosis not present

## 2016-03-27 ENCOUNTER — Ambulatory Visit
Admission: RE | Admit: 2016-03-27 | Discharge: 2016-03-27 | Disposition: A | Discharge status: Home / Self Care | Payer: 59 | Source: Ambulatory Visit | Attending: Radiation Oncology | Admitting: Radiation Oncology

## 2016-03-27 DIAGNOSIS — C50112 Malignant neoplasm of central portion of left female breast: Secondary | ICD-10-CM | POA: Diagnosis not present

## 2016-03-27 DIAGNOSIS — D0512 Intraductal carcinoma in situ of left breast: Secondary | ICD-10-CM | POA: Diagnosis not present

## 2016-03-28 ENCOUNTER — Ambulatory Visit
Admission: RE | Admit: 2016-03-28 | Discharge: 2016-03-28 | Disposition: A | Discharge status: Home / Self Care | Payer: 59 | Source: Ambulatory Visit | Attending: Radiation Oncology | Admitting: Radiation Oncology

## 2016-03-28 DIAGNOSIS — Z923 Personal history of irradiation: Secondary | ICD-10-CM | POA: Diagnosis not present

## 2016-03-28 DIAGNOSIS — C50112 Malignant neoplasm of central portion of left female breast: Secondary | ICD-10-CM | POA: Diagnosis not present

## 2016-03-28 DIAGNOSIS — D0512 Intraductal carcinoma in situ of left breast: Secondary | ICD-10-CM | POA: Diagnosis not present

## 2016-03-31 ENCOUNTER — Ambulatory Visit
Admission: RE | Admit: 2016-03-31 | Discharge: 2016-03-31 | Disposition: A | Discharge status: Home / Self Care | Payer: 59 | Source: Ambulatory Visit | Attending: Radiation Oncology | Admitting: Radiation Oncology

## 2016-03-31 DIAGNOSIS — D0512 Intraductal carcinoma in situ of left breast: Secondary | ICD-10-CM | POA: Diagnosis not present

## 2016-03-31 DIAGNOSIS — C50112 Malignant neoplasm of central portion of left female breast: Secondary | ICD-10-CM | POA: Diagnosis not present

## 2016-04-01 ENCOUNTER — Encounter: Payer: Self-pay | Admitting: Radiation Oncology

## 2016-04-01 ENCOUNTER — Ambulatory Visit
Admission: RE | Admit: 2016-04-01 | Discharge: 2016-04-01 | Disposition: A | Discharge status: Home / Self Care | Payer: 59 | Source: Ambulatory Visit | Attending: Radiation Oncology | Admitting: Radiation Oncology

## 2016-04-01 VITALS — BP 127/87 | HR 87 | Temp 97.6°F | Ht 65.0 in | Wt 192.2 lb

## 2016-04-01 DIAGNOSIS — Z923 Personal history of irradiation: Secondary | ICD-10-CM | POA: Diagnosis not present

## 2016-04-01 DIAGNOSIS — C50112 Malignant neoplasm of central portion of left female breast: Secondary | ICD-10-CM | POA: Diagnosis not present

## 2016-04-01 DIAGNOSIS — D0512 Intraductal carcinoma in situ of left breast: Secondary | ICD-10-CM | POA: Diagnosis present

## 2016-04-01 MED ORDER — BIAFINE EX EMUL
Freq: Once | CUTANEOUS | Status: AC
  Administered 2016-04-01: 12:00:00 via TOPICAL

## 2016-04-01 NOTE — Progress Notes (Signed)
Jennifer Rodgers has completed 11 fractions to her left breast.  She denies having pain.  She does feel fatigue.  She is using radiaplex as directed.  The skin on left breast is red.  She has dermatitis on the upper inner portion and underneath her left breast.  She has been given biafine to try.  BP 127/87 (BP Location: Right Arm, Patient Position: Sitting)   Pulse 87   Temp 97.6 F (36.4 C) (Oral)   Ht 5\' 5"  (1.651 m)   Wt 192 lb 3.2 oz (87.2 kg)   SpO2 98%   BMI 31.98 kg/m    Wt Readings from Last 3 Encounters:  04/01/16 192 lb 3.2 oz (87.2 kg)  03/25/16 193 lb (87.5 kg)  03/19/16 190 lb 12.8 oz (86.5 kg)

## 2016-04-01 NOTE — Progress Notes (Signed)
  Radiation Oncology         [phone] ________________________________  Name: Jennifer Rodgers MRN: [id number]  Date: 04/01/2016  DOB: [birth year]  Weekly Radiation Therapy Management    ICD-9-CM ICD-10-CM   1. 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 . . . . . . . The patient presents for routine under treatment assessment. Kenia has completed 11 fractions to her left breast.  She denies having pain.  She does feel fatigue.  She is using radiaplex as directed.  The skin on left breast is red.  She has dermatitis on the upper inner portion and underneath her left breast.  She has been given biafine to try.                                   Set-up films were reviewed.                                 The chart was checked. Physical Findings. . .  height is 5\' 5"  (1.651 m) and weight is 192 lb 3.2 oz (87.2 kg). Her oral temperature is 97.6 F (36.4 C). Her blood pressure is 127/87 and her pulse is 87. Her oxygen saturation is 98%. . Weight essentially stable. Lungs are clear to auscultation bilaterally. Heart has regular rate and rhythm. Mild erythema of the left breast. Some dermatitis noted in the upper inner aspect of the breast. Impression . . . . . . . The patient is tolerating radiation. Plan . . . . . . . . . . . . Continue treatment as planned.  ________________________________   Blair Promise, PhD, MD  This document serves as a record of services personally performed by Gery Pray, MD. It was created on his behalf by Maryla Morrow, a trained medical scribe. The creation of this record is based on the scribe's personal observations and the provider's statements to them. This document has been checked and approved by the attending provider.

## 2016-04-02 ENCOUNTER — Ambulatory Visit
Admission: RE | Admit: 2016-04-02 | Discharge: 2016-04-02 | Disposition: A | Discharge status: Home / Self Care | Payer: 59 | Source: Ambulatory Visit | Attending: Radiation Oncology | Admitting: Radiation Oncology

## 2016-04-02 DIAGNOSIS — D0512 Intraductal carcinoma in situ of left breast: Secondary | ICD-10-CM | POA: Diagnosis not present

## 2016-04-02 DIAGNOSIS — C50112 Malignant neoplasm of central portion of left female breast: Secondary | ICD-10-CM | POA: Diagnosis not present

## 2016-04-03 ENCOUNTER — Ambulatory Visit
Admission: RE | Admit: 2016-04-03 | Discharge: 2016-04-03 | Disposition: A | Discharge status: Home / Self Care | Payer: 59 | Source: Ambulatory Visit | Attending: Radiation Oncology | Admitting: Radiation Oncology

## 2016-04-03 DIAGNOSIS — C50112 Malignant neoplasm of central portion of left female breast: Secondary | ICD-10-CM | POA: Diagnosis not present

## 2016-04-03 DIAGNOSIS — D0512 Intraductal carcinoma in situ of left breast: Secondary | ICD-10-CM | POA: Diagnosis not present

## 2016-04-04 ENCOUNTER — Ambulatory Visit
Admission: RE | Admit: 2016-04-04 | Discharge: 2016-04-04 | Disposition: A | Discharge status: Home / Self Care | Payer: 59 | Source: Ambulatory Visit | Attending: Radiation Oncology | Admitting: Radiation Oncology

## 2016-04-04 DIAGNOSIS — D0512 Intraductal carcinoma in situ of left breast: Secondary | ICD-10-CM | POA: Diagnosis not present

## 2016-04-04 DIAGNOSIS — C50112 Malignant neoplasm of central portion of left female breast: Secondary | ICD-10-CM | POA: Diagnosis not present

## 2016-04-07 ENCOUNTER — Ambulatory Visit
Admission: RE | Admit: 2016-04-07 | Discharge: 2016-04-07 | Disposition: A | Discharge status: Home / Self Care | Payer: 59 | Source: Ambulatory Visit | Attending: Radiation Oncology | Admitting: Radiation Oncology

## 2016-04-07 DIAGNOSIS — D0512 Intraductal carcinoma in situ of left breast: Secondary | ICD-10-CM | POA: Diagnosis not present

## 2016-04-07 DIAGNOSIS — C50112 Malignant neoplasm of central portion of left female breast: Secondary | ICD-10-CM | POA: Diagnosis not present

## 2016-04-08 ENCOUNTER — Ambulatory Visit
Admission: RE | Admit: 2016-04-08 | Discharge: 2016-04-08 | Disposition: A | Discharge status: Home / Self Care | Payer: 59 | Source: Ambulatory Visit | Attending: Radiation Oncology | Admitting: Radiation Oncology

## 2016-04-08 VITALS — BP 126/77 | HR 91 | Temp 98.0°F | Resp 17 | Wt 192.2 lb

## 2016-04-08 DIAGNOSIS — D0512 Intraductal carcinoma in situ of left breast: Secondary | ICD-10-CM | POA: Diagnosis not present

## 2016-04-08 DIAGNOSIS — C50112 Malignant neoplasm of central portion of left female breast: Secondary | ICD-10-CM | POA: Diagnosis not present

## 2016-04-08 DIAGNOSIS — Z923 Personal history of irradiation: Secondary | ICD-10-CM | POA: Insufficient documentation

## 2016-04-08 MED ORDER — RADIAPLEXRX EX GEL
Freq: Once | CUTANEOUS | Status: AC
  Administered 2016-04-08: 15:00:00 via TOPICAL

## 2016-04-08 NOTE — Progress Notes (Signed)
  Radiation Oncology         [phone] ________________________________  Name: Jennifer Rodgers MRN: [id number]  Date: 04/08/2016  DOB: April 01, 1955  Weekly Radiation Therapy Management    ICD-9-CM ICD-10-CM   1. 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 . . . . . . . The patient presents for routine under treatment assessment.  Jennifer Rodgers has completed 16 fractions to her left breast. Pt denies any pain but does endorse occasional pain to left breast area that is intermittent. Pt reports she was too fatigued to work last Friday because she could not get out of bed. She endorses slight improvement regarding fatigue. She endorses a loss of appetite. Pt states she is using radiaplex to left breast BID and biafine for itching. Pt requests refill of radiaplex today. She states she will try B12 injection via PCP.                                   Set-up films were reviewed.                                 The chart was checked. Physical Findings. . .  weight is 192 lb 3.2 oz (87.2 kg). Her oral temperature is 98 F (36.7 C). Her blood pressure is 126/77 and her pulse is 91. Her respiration is 17 and oxygen saturation is 99%. . Weight essentially stable. Lungs are clear to auscultation bilaterally. Heart has regular rate and rhythm.Erythema and radiation dermatitis throughout the breast no skin breakdown  Impression . . . . . . . The patient is tolerating radiation. Plan . . . . . . . . . . . . Continue treatment as planned. I suggested use of hydrocortisone cream prn for itching if other topical treatments are inadequate.  ________________________________   Blair Promise, PhD, MD  This document serves as a record of services personally performed by Gery Pray, MD. It was created on his behalf by Maryla Morrow, a trained medical scribe. The creation of this record is  based on the scribe's personal observations and the provider's statements to them. This document has been checked and approved by the attending provider.

## 2016-04-08 NOTE — Progress Notes (Signed)
Iona Knappenberger here today for 16 fraction to left breast.  Plan to start boost tomorrow.  Patient denies any pain presently but does occasionally have some intermittent pain to left breast area.  She states that her fatigue has slightly improved.  She states her appetite is poor.  She uses radiaplex to left breast twice daily and uses biafine for itching.  She needs a refill of radiaplex today.  Skin to left breast is intact.  She has rash present to left breast with redness.   Vitals:   04/08/16 1137  BP: 126/77  Pulse: 91  Resp: 17  Temp: 98 F (36.7 C)  TempSrc: Oral  SpO2: 99%  Weight: 192 lb 3.2 oz (87.2 kg)   Wt Readings from Last 3 Encounters:  04/08/16 192 lb 3.2 oz (87.2 kg)  04/01/16 192 lb 3.2 oz (87.2 kg)  03/25/16 193 lb (87.5 kg)

## 2016-04-08 NOTE — Addendum Note (Signed)
Encounter addended by: Wilmon Arms, RN on: 04/08/2016  3:02 PM<BR>    Actions taken: [facility] administration accepted

## 2016-04-09 ENCOUNTER — Ambulatory Visit
Admission: RE | Admit: 2016-04-09 | Discharge: 2016-04-09 | Disposition: A | Discharge status: Home / Self Care | Payer: 59 | Source: Ambulatory Visit | Attending: Radiation Oncology | Admitting: Radiation Oncology

## 2016-04-09 DIAGNOSIS — D0512 Intraductal carcinoma in situ of left breast: Secondary | ICD-10-CM | POA: Diagnosis not present

## 2016-04-09 DIAGNOSIS — C50112 Malignant neoplasm of central portion of left female breast: Secondary | ICD-10-CM | POA: Diagnosis not present

## 2016-04-10 ENCOUNTER — Ambulatory Visit
Admission: RE | Admit: 2016-04-10 | Discharge: 2016-04-10 | Disposition: A | Discharge status: Home / Self Care | Payer: 59 | Source: Ambulatory Visit | Attending: Radiation Oncology | Admitting: Radiation Oncology

## 2016-04-10 DIAGNOSIS — D0512 Intraductal carcinoma in situ of left breast: Secondary | ICD-10-CM | POA: Diagnosis not present

## 2016-04-10 DIAGNOSIS — C50112 Malignant neoplasm of central portion of left female breast: Secondary | ICD-10-CM | POA: Diagnosis not present

## 2016-04-11 ENCOUNTER — Ambulatory Visit
Admission: RE | Admit: 2016-04-11 | Discharge: 2016-04-11 | Disposition: A | Discharge status: Home / Self Care | Payer: 59 | Source: Ambulatory Visit | Attending: Radiation Oncology | Admitting: Radiation Oncology

## 2016-04-11 DIAGNOSIS — C50112 Malignant neoplasm of central portion of left female breast: Secondary | ICD-10-CM | POA: Diagnosis not present

## 2016-04-11 DIAGNOSIS — D0512 Intraductal carcinoma in situ of left breast: Secondary | ICD-10-CM | POA: Diagnosis not present

## 2016-04-14 ENCOUNTER — Ambulatory Visit
Admission: RE | Admit: 2016-04-14 | Discharge: 2016-04-14 | Disposition: A | Discharge status: Home / Self Care | Payer: 59 | Source: Ambulatory Visit | Attending: Radiation Oncology | Admitting: Radiation Oncology

## 2016-04-14 DIAGNOSIS — Z923 Personal history of irradiation: Secondary | ICD-10-CM | POA: Diagnosis not present

## 2016-04-14 DIAGNOSIS — D0512 Intraductal carcinoma in situ of left breast: Secondary | ICD-10-CM | POA: Diagnosis not present

## 2016-04-14 DIAGNOSIS — Z08 Encounter for follow-up examination after completed treatment for malignant neoplasm: Secondary | ICD-10-CM | POA: Diagnosis not present

## 2016-04-14 DIAGNOSIS — C50112 Malignant neoplasm of central portion of left female breast: Secondary | ICD-10-CM | POA: Diagnosis not present

## 2016-04-15 ENCOUNTER — Encounter: Payer: Self-pay | Admitting: Radiation Oncology

## 2016-04-15 ENCOUNTER — Ambulatory Visit
Admission: RE | Admit: 2016-04-15 | Discharge: 2016-04-15 | Disposition: A | Discharge status: Home / Self Care | Payer: 59 | Source: Ambulatory Visit | Attending: Radiation Oncology | Admitting: Radiation Oncology

## 2016-04-15 ENCOUNTER — Telehealth: Payer: Self-pay | Admitting: *Deleted

## 2016-04-15 VITALS — BP 143/85 | HR 98 | Temp 97.9°F | Ht 65.0 in | Wt 191.8 lb

## 2016-04-15 DIAGNOSIS — Z08 Encounter for follow-up examination after completed treatment for malignant neoplasm: Secondary | ICD-10-CM | POA: Diagnosis not present

## 2016-04-15 DIAGNOSIS — Z923 Personal history of irradiation: Secondary | ICD-10-CM | POA: Insufficient documentation

## 2016-04-15 DIAGNOSIS — C50112 Malignant neoplasm of central portion of left female breast: Secondary | ICD-10-CM | POA: Diagnosis not present

## 2016-04-15 DIAGNOSIS — D0512 Intraductal carcinoma in situ of left breast: Secondary | ICD-10-CM

## 2016-04-15 MED ORDER — BIAFINE EX EMUL
Freq: Once | CUTANEOUS | Status: AC
  Administered 2016-04-15: 12:00:00 via TOPICAL

## 2016-04-15 NOTE — Progress Notes (Signed)
Jennifer Rodgers has completed treatment with 21 fractions to her left breast.  She reports having pain in her left breast especially at night.  She reports having fatigue in the afternoons.  She is using radiaplex and biafine and has been provided with a refill.  She is also using hydrocortisone cream for itching.  The skin on her left breast is red with dermatitis.  She has been provided with a one month follow up appointment.  BP (!) 143/85 (BP Location: Right Arm, Patient Position: Sitting)   Pulse 98   Temp 97.9 F (36.6 C) (Oral)   Ht 5\' 5"  (1.651 m)   Wt 191 lb 12.8 oz (87 kg)   SpO2 99%   BMI 31.92 kg/m    Wt Readings from Last 3 Encounters:  04/15/16 191 lb 12.8 oz (87 kg)  04/08/16 192 lb 3.2 oz (87.2 kg)  04/01/16 192 lb 3.2 oz (87.2 kg)

## 2016-04-15 NOTE — Telephone Encounter (Signed)
  Oncology Nurse Navigator Documentation  Navigator Location: [facility] (04/15/16 1300)   )Navigator Encounter Type: Telephone (04/15/16 1300) Telephone: Outgoing Call (04/15/16 1300)     Surgery Date: 01/31/16 (04/15/16 1300) Genetic Counseling Date:  (None) (04/15/16 1300) Genetic Counseling Type:  (None) (04/15/16 1300) Plastic Surgery Consult Date:  (None) (04/15/16 1300) Multidisiplinary Clinic Date:  (None) (04/15/16 1300) Multidisiplinary Clinic Type: Breast (04/15/16 1300)   Patient Visit Type: XNATFT (04/15/16 1300) Treatment Phase: Final Radiation Tx (04/15/16 1300) Barriers/Navigation Needs: No barriers at this time;No Questions;No Needs (04/15/16 1300)   Interventions: Referrals (04/15/16 1300) Referrals: Survivorship (04/15/16 1300)          Acuity: Level 1 (04/15/16 1300)         Time Spent with Patient: 15 (04/15/16 1300)

## 2016-04-15 NOTE — Progress Notes (Signed)
  Radiation Oncology         (336) [phone] ________________________________  Name: Jennifer Rodgers MRN: [id number]  Date: 04/15/2016  DOB: 16-Oct-1955  Weekly Radiation Therapy Management    ICD-9-CM ICD-10-CM   1. 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 . . . . . . . The patient presents for routine under treatment assessment.  Jennifer Rodgers has completed treatment with 21 fractions to her left breast.  She reports having pain in her left breast, especially at night. She is not taking anything for the pain. She reports having fatigue in the afternoons.  She is using radiaplex and biafine. She has been provided with a refill.  She is also using hydrocortisone cream for itching.  The skin on her left breast is red with dermatitis.                                  Set-up films were reviewed.                                 The chart was checked. Physical Findings. . .  height is 5\' 5"  (1.651 m) and weight is 191 lb 12.8 oz (87 kg). Her oral temperature is 97.9 F (36.6 C). Her blood pressure is 143/85 (abnormal) and her pulse is 98. Her oxygen saturation is 99%. . Weight essentially stable. Lungs are clear to auscultation bilaterally. Heart has regular rate and rhythm. Diffuse erythema and radiation dermatitis throughout the left breast with no skin breakdown . Impression . . . . . . . The patient has tolerated radiation. Plan . . . . . . . . . . . . The patient completed treatment and she has been provided with a one month follow up appointment card. ________________________________   Blair Promise, PhD, MD  This document serves as a record of services personally performed by Gery Pray, MD. It was created on his behalf by Darcus Austin, a trained medical scribe. The creation of this record is based on the scribe's personal observations and the provider's statements to them.  This document has been checked and approved by the attending provider.

## 2016-04-15 NOTE — Progress Notes (Signed)
  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 The patient had pain of the left breast for which she did not take anything. She would have fatigue in the afternoons and she had a loss of appetite. She had pruritus for which she used hydrocortisone cream and biafine. The patient developed diffuse erythema and radiation dermatitis throughout the left breast with no skin breakdown.  Plan: The patient has completed radiation treatment. The patient will return to radiation oncology clinic for routine followup in one month. I advised them to call or return sooner if they have any questions or concerns related to their recovery or treatment.  -----------------------------------  Blair Promise, PhD, MD  This document serves as a record of services personally performed by Gery Pray, MD. It was created on his behalf by Darcus Austin, a trained medical scribe. The creation of this record is based on the scribe's personal observations and the provider's statements to them. This document has been checked and approved by the attending provider.

## 2016-04-16 ENCOUNTER — Telehealth: Payer: Self-pay | Admitting: Oncology

## 2016-04-16 ENCOUNTER — Ambulatory Visit: Payer: 59

## 2016-04-16 NOTE — Telephone Encounter (Signed)
lvm to inform pt of SCP appt in May per LOS

## 2016-04-17 ENCOUNTER — Ambulatory Visit: Payer: 59

## 2016-04-18 ENCOUNTER — Ambulatory Visit: Payer: 59

## 2016-04-21 ENCOUNTER — Ambulatory Visit: Payer: 59

## 2016-04-22 ENCOUNTER — Ambulatory Visit: Payer: 59

## 2016-04-23 ENCOUNTER — Ambulatory Visit: Payer: 59

## 2016-04-24 ENCOUNTER — Ambulatory Visit: Payer: 59

## 2016-04-25 ENCOUNTER — Ambulatory Visit: Payer: 59

## 2016-04-28 ENCOUNTER — Ambulatory Visit: Payer: 59

## 2016-04-29 ENCOUNTER — Ambulatory Visit: Payer: 59

## 2016-04-30 ENCOUNTER — Ambulatory Visit: Payer: 59

## 2016-05-01 ENCOUNTER — Ambulatory Visit: Payer: 59

## 2016-05-06 DIAGNOSIS — Z0000 Encounter for general adult medical examination without abnormal findings: Secondary | ICD-10-CM | POA: Diagnosis not present

## 2016-05-16 ENCOUNTER — Other Ambulatory Visit: Payer: Self-pay | Admitting: Oncology

## 2016-05-23 DIAGNOSIS — Z86000 Personal history of in-situ neoplasm of breast: Secondary | ICD-10-CM | POA: Diagnosis not present

## 2016-05-26 DIAGNOSIS — D123 Benign neoplasm of transverse colon: Secondary | ICD-10-CM | POA: Diagnosis not present

## 2016-05-26 DIAGNOSIS — K635 Polyp of colon: Secondary | ICD-10-CM | POA: Diagnosis not present

## 2016-05-26 DIAGNOSIS — Z1211 Encounter for screening for malignant neoplasm of colon: Secondary | ICD-10-CM | POA: Diagnosis not present

## 2016-05-26 DIAGNOSIS — Z8371 Family history of colonic polyps: Secondary | ICD-10-CM | POA: Diagnosis not present

## 2016-06-05 ENCOUNTER — Ambulatory Visit: Payer: 59 | Admitting: Radiation Oncology

## 2016-06-06 ENCOUNTER — Encounter: Payer: Self-pay | Admitting: Oncology

## 2016-06-09 ENCOUNTER — Ambulatory Visit
Admission: RE | Admit: 2016-06-09 | Discharge: 2016-06-09 | Disposition: A | Discharge status: Home / Self Care | Payer: 59 | Source: Ambulatory Visit | Attending: Radiation Oncology | Admitting: Radiation Oncology

## 2016-06-09 ENCOUNTER — Encounter: Payer: Self-pay | Admitting: Radiation Oncology

## 2016-06-09 DIAGNOSIS — Z9889 Other specified postprocedural states: Secondary | ICD-10-CM | POA: Insufficient documentation

## 2016-06-09 DIAGNOSIS — D0512 Intraductal carcinoma in situ of left breast: Secondary | ICD-10-CM | POA: Diagnosis not present

## 2016-06-09 DIAGNOSIS — Z170 Estrogen receptor positive status [ER+]: Secondary | ICD-10-CM | POA: Diagnosis not present

## 2016-06-09 NOTE — Progress Notes (Signed)
Jennifer Rodgers is here for follow up.  She denies having pain but does report having occasional sharp/deep pains in her left breast.  She is taking Tamoxifen.  She reports her energy level is improving.  The skin on her left breast has slight hyperpigmentation.  She has a small area of dermatitis on the upper inner portion of her breast.  BP 124/76 (BP Location: Right Arm, Patient Position: Sitting)   Pulse 96   Temp 98.2 F (36.8 C) (Oral)   Ht 5\' 5"  (1.651 m)   Wt 192 lb 3.2 oz (87.2 kg)   SpO2 100%   BMI 31.98 kg/m    Wt Readings from Last 3 Encounters:  06/09/16 192 lb 3.2 oz (87.2 kg)  04/15/16 191 lb 12.8 oz (87 kg)  04/08/16 192 lb 3.2 oz (87.2 kg)

## 2016-06-09 NOTE — Progress Notes (Signed)
Radiation Oncology         [phone]) [phone] ________________________________  Name: ABBEE Rodgers MRN: [id number]  Date: 06/09/2016  DOB: 1955/02/25  Follow-Up Visit Note  CC: [name], MD  Magrinat, Virgie Dad, MD    ICD-9-CM ICD-10-CM   1. 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 The patient reports occasional sharp/deep pain in her left breast. She is taking Tamoxifen. She reports improving energy levels. She denies pain. Per nursing, left breast has slight hyperpigmentation, and a small area of dermatitis on the upper inner portion of her breast.                          ALLERGIES:  is allergic to prednisone.  Meds: Current Outpatient Prescriptions  Medication Sig Dispense Refill  . Omega-3 Fatty Acids (FISH OIL CONCENTRATE PO) Take 2 capsules by mouth daily.    . pantoprazole (PROTONIX) 40 MG tablet Take 40 mg by mouth daily.    Marland Kitchen SYNTHROID 100 MCG tablet Take 1 tablet by mouth daily.    . tamoxifen (NOLVADEX) 20 MG tablet Take 1 tablet (20 mg total) by mouth daily. 90 tablet 4  . vitamin B-12 (CYANOCOBALAMIN) 1000 MCG tablet Take 1,000 mcg by mouth 2 (two) times daily.    . vitamin C (ASCORBIC ACID) 250 MG tablet Take 250 mg by mouth 2 (two) times daily.    Marland Kitchen VITAMIN D, CHOLECALCIFEROL, PO Take 1,000 Units by mouth daily.     . tamoxifen (NOLVADEX) 10 MG tablet Take 1 tablet by mouth daily.     No current facility-administered medications for this encounter.     Physical Findings: The patient is in no acute distress. Patient is alert and oriented.  height is 5\' 5"  (1.651 m) and weight is 192 lb 3.2 oz (87.2 kg). Her oral temperature is 98.2 F  (36.8 C). Her blood pressure is 124/76 and her pulse is 96. Her oxygen saturation is 100%. .  No significant changes. Lungs are clear to auscultation bilaterally. Heart has regular rate and rhythm. No palpable cervical, supraclavicular, or axillary adenopathy. Abdomen soft, non-tender, normal bowel sounds. Right breast no palpable mass, nipple discharge, or bleeding. Left breast patient has some mild hyperpigmentation changes. The patient's skin is well healed. No signs of skin breakdown over lumpectomy scar. Mild thickening of upper area of breast but no palpable mass.  Lab Findings: Lab Results  Component Value Date   WBC 5.3 01/28/2016   HGB 11.8 (L) 01/28/2016   HCT 36.0 01/28/2016   MCV 87.4 01/28/2016   PLT 272 01/28/2016    Radiographic Findings: No results found.  Impression:  The patient is recovering from the effects of radiation. No evidence of recurrence on clinical exam.  Plan:  The patient will follow-up with radiation oncology on a prn basis. She will remain under close follow-up with surgery and medical oncology.  ____________________________________   This document serves as a record of services personally performed by Gery Pray, MD. It was created on his behalf by Bethann Humble, a trained medical scribe. The creation of this record is based on the scribe's  personal observations and the provider's statements to them. This document has been checked and approved by the attending provider.

## 2016-06-10 ENCOUNTER — Ambulatory Visit (HOSPITAL_BASED_OUTPATIENT_CLINIC_OR_DEPARTMENT_OTHER): Payer: 59 | Admitting: Adult Health

## 2016-06-10 ENCOUNTER — Encounter: Payer: Self-pay | Admitting: Adult Health

## 2016-06-10 VITALS — BP 121/72 | HR 90 | Temp 98.2°F | Resp 18 | Ht 65.0 in | Wt 192.0 lb

## 2016-06-10 DIAGNOSIS — D0512 Intraductal carcinoma in situ of left breast: Secondary | ICD-10-CM

## 2016-06-10 NOTE — Progress Notes (Signed)
CLINIC:  Survivorship   REASON FOR VISIT:  Routine follow-up post-treatment for a recent history of breast cancer.  BRIEF ONCOLOGIC HISTORY:    Ductal carcinoma in situ (DCIS) of left breast   11/07/2015 Initial Diagnosis    Ductal carcinoma in situ (DCIS) of left breast     11/2015 -  Anti-estrogen oral therapy    Tamoxifen 105m daily      01/31/2016 Surgery    ([facility]) Right lumpectomy: LCIS and atypical lobular hyperplasia involving adenosis Left lumpectomy: low grade DCIS with calcifications, focal atypical lobular hyperplasia, margins negative, ER 100%, PR 100%        03/18/2016 - 04/15/2016 Radiation Therapy    1) Left Breast: 42.72 Gy in 16 fractions  2) Left Breast Boost: 10 Gy in 5 fractions       INTERVAL HISTORY:  [facility] to review her survivorship care plan detailing her treatment course for breast cancer, as well as monitoring long-term side effects of that treatment, education regarding health maintenance, screening, and overall wellness and health promotion.     Overall, Jennifer Rodgers feeling quite well.  She is taking Tamoxifen daily and is tolerating it well.  She denies any current issues or concerns today.      REVIEW OF SYSTEMS:  Review of Systems  Constitutional: Negative for appetite change, chills, diaphoresis, fatigue, fever and unexpected weight change.  HENT:   Negative for hearing loss and lump/mass.   Eyes: Negative for eye problems and icterus.  Respiratory: Negative for chest tightness, cough and shortness of breath.   Cardiovascular: Negative for chest pain, leg swelling and palpitations.  Gastrointestinal: Negative for abdominal distention and abdominal pain.  Endocrine: Negative for hot flashes.  Genitourinary: Negative for difficulty urinating.   Musculoskeletal: Negative for arthralgias.  Skin: Negative for itching and rash.  Neurological: Negative for  dizziness, extremity weakness and headaches.  Hematological: Negative for adenopathy. Does not bruise/bleed easily.  Psychiatric/Behavioral: Negative for depression and sleep disturbance. The patient is not nervous/anxious.   Breast: Denies any new nodularity, masses, tenderness, nipple changes, or nipple discharge.       ONCOLOGY TREATMENT TEAM:  1. [facility] 2. Medical Oncologist: Dr. MJana Hakim3. Radiation Oncologist: Dr. KSondra Come   PAST MEDICAL/SURGICAL HISTORY:  Past Medical History:  Diagnosis Date  . Abdominal pain, unspecified site   . Acute pharyngitis   . Acute upper respiratory infections of unspecified site   . Allergic rhinitis, cause unspecified   . [place]   Left breast   . Degeneration of cervical intervertebral disc   . Diaphragmatic hernia without mention of obstruction or gangrene   . Dysuria   . Esophageal reflux   . History of radiation therapy 03/18/16-04/15/16   left breast 42.72 Gy in 6 fractions, boost 10 Gy in 5 fractions  . Migraine without aura, without mention of intractable migraine without mention of status migrainosus   . Nausea alone   . Other constipation   . Other malaise and fatigue   . PONV (postoperative nausea and vomiting)   . Screening for depression   . Unspecified hypothyroidism   . Unspecified venous (peripheral) insufficiency   . Urinary frequency    Past Surgical History:  Procedure Laterality Date  . BREAST LUMPECTOMY WITH NEEDLE LOCALIZATION Bilateral 01/31/2016   Procedure: RIGHT BREAST LUMPECTOMY WITH DOUBLE NEEDLE LOCALIZATION, LEFT BREAST NEEDLE LOCALIZED LUMPECTOMY;  Surgeon: Erroll Luna, MD;  Location: [facility];  Service: General;  Laterality: Bilateral;  . COLONOSCOPY  03/03/2011  . LUMBAR LAMINECTOMY  2005  . SALPINGOOPHORECTOMY  2004  . SHOULDER ARTHROSCOPY Left 2008  . THYROID SURGERY  2006   resection of L nodule     ALLERGIES:    Allergies  Allergen Reactions  . Prednisone Other (See Comments)    Extreme facial flushing and confusion     CURRENT MEDICATIONS:  Outpatient Encounter Prescriptions as of 06/10/2016  Medication Sig Note  . Omega-3 Fatty Acids (FISH OIL CONCENTRATE PO) Take 2 capsules by mouth daily.   . pantoprazole (PROTONIX) 40 MG tablet Take 40 mg by mouth daily.   Marland Kitchen SYNTHROID 100 MCG tablet Take 1 tablet by mouth daily. 11/07/2015: Received from: External Pharmacy  . tamoxifen (NOLVADEX) 20 MG tablet Take 1 tablet (20 mg total) by mouth daily.   . vitamin B-12 (CYANOCOBALAMIN) 1000 MCG tablet Take 1,000 mcg by mouth 2 (two) times daily.   . vitamin C (ASCORBIC ACID) 250 MG tablet Take 250 mg by mouth 2 (two) times daily.   Marland Kitchen VITAMIN D, CHOLECALCIFEROL, PO Take 1,000 Units by mouth daily.  11/02/2015: D3  . [DISCONTINUED] tamoxifen (NOLVADEX) 10 MG tablet Take 1 tablet by mouth daily.    No facility-administered encounter medications on file as of 06/10/2016.      ONCOLOGIC FAMILY HISTORY:  Family History  Problem Relation Age of Onset  . Hypertension Mother   . Thyroid disease Mother   . Hyperlipidemia Mother   . Cancer Mother        breast  . Hypertension Sister   . Hyperlipidemia Sister   . Diabetes Sister   . Hypertension Daughter   . Hyperlipidemia Daughter      GENETIC COUNSELING/TESTING: Not indicated at this time  SOCIAL HISTORY:  Jennifer Rodgers is married and lives with her husband in [place], [place].  She has one daughter who lives about 10 minutes away.  Jennifer Rodgers is currently working full time at [employer].  She denies any current or history of tobacco, alcohol, or illicit drug use.     PHYSICAL EXAMINATION:  Vital Signs:   Vitals:   06/10/16 1040  BP: 121/72  Pulse: 90  Resp: 18  Temp: 98.2 F (36.8 C)   Filed Weights   06/10/16 1040  Weight: 192 lb (87.1 kg)   General: Well-nourished, well-appearing female in no acute distress.  She  is unaccompanied today.   HEENT: Head is normocephalic.  Pupils equal and reactive to light. Conjunctivae clear without exudate.  Sclerae anicteric. Oral mucosa is pink, moist.  Oropharynx is pink without lesions or erythema.  Lymph: No cervical, supraclavicular, or infraclavicular lymphadenopathy noted on palpation.  Cardiovascular: Regular rate and rhythm.Marland Kitchen Respiratory: Clear to auscultation bilaterally. Chest expansion symmetric; breathing non-labored.  GI: Abdomen soft and round; non-tender, non-distended. Bowel sounds normoactive.  GU: Deferred.  Neuro: No focal deficits. Steady gait.  Psych: Mood and affect normal and appropriate for situation.  Extremities: No edema. Skin: Warm and dry.  LABORATORY DATA:  None for this visit.  DIAGNOSTIC IMAGING:  None for this visit.      ASSESSMENT AND PLAN:  Jennifer Rodgers is a pleasant 61 y.o. female with Stage 0 left breast DCIS, ER+/PR+/HER2-, diagnosed in 10/2015, treated with lumpectomy, adjuvant radiation therapy, and anti-estrogen therapy with Tamoxifen beginning in 11/2015.  She presents to the Survivorship Clinic for our initial meeting and  routine follow-up post-completion of treatment for breast cancer.    1. Stage 0 left breast cancer:  Jennifer Rodgers is continuing to recover from definitive treatment for breast cancer. She will follow-up with her medical oncologist, Dr. Jana Hakim in August, 2018 with history and physical exam per surveillance protocol.  She will continue her anti-estrogen therapy with Tamoxifen. Thus far, she is tolerating the Tamoxifen well, with minimal side effects. She was instructed to make Dr. Jana Hakim or myself aware if she begins to experience any worsening side effects of the medication and I could see her back in clinic to help manage those side effects, as needed.  Today, a comprehensive survivorship care plan and treatment summary was reviewed with the patient today detailing her breast cancer diagnosis, treatment  course, potential late/long-term effects of treatment, appropriate follow-up care with recommendations for the future, and patient education resources.  A copy of this summary, along with a letter will be sent to the patient's primary care provider via mail/fax/In Basket message after today's visit.    2. Bone health:  Given Jennifer Rodgers's age/history of breast cancer she is at risk for bone demineralization.  She was encouraged to increase her consumption of foods rich in calcium, as well as increase her weight-bearing activities.  She was given education on specific activities to promote bone health.  3. Cancer screening:  Due to Jennifer Rodgers's history and her age, she should receive screening for skin cancers, colon cancer, and gynecologic cancers.  The information and recommendations are listed on the patient's comprehensive care plan/treatment summary and were reviewed in detail with the patient.    4. Health maintenance and wellness promotion: Jennifer Rodgers was encouraged to consume 5-7 servings of fruits and vegetables per day. We reviewed the "Nutrition Rainbow" handout, as well as the handout "Take Control of Your Health and Reduce Your Cancer Risk" from the [place].  She was also encouraged to engage in moderate to vigorous exercise for 30 minutes per day most days of the week. We discussed the [employer] fitness program, which is designed for cancer survivors to help them become more physically fit after cancer treatments.  She was instructed to limit her alcohol consumption and continue to abstain from tobacco use..     5. Support services/counseling: It is not uncommon for this period of the patient's cancer care trajectory to be one of many emotions and stressors.  We discussed an opportunity for her to participate in the next session of [facility] ("Finding Your New Normal") support group series designed for patients after they have completed treatment.   Jennifer Rodgers was encouraged to take  advantage of our many other support services programs, support groups, and/or counseling in coping with her new life as a cancer survivor after completing anti-cancer treatment.  She was offered support today through active listening and expressive supportive counseling.  She was given information regarding our available services and encouraged to contact me with any questions or for help enrolling in any of our support group/programs.    Dispo:   -Return to cancer center in August, 2018 as scheduled with Dr. Jana Hakim  -She is welcome to return back to the Survivorship Clinic at any time; no additional follow-up needed at this time.  -Consider referral back to survivorship as a long-term survivor for continued surveillance  A total of (30) minutes of face-to-face time was spent with this patient with greater than 50% of that time in counseling and care-coordination.   Charlestine Massed  Delice Bison, [place] [phone]   Note: PRIMARY CARE PROVIDER [name], [place] [phone]

## 2016-09-15 ENCOUNTER — Other Ambulatory Visit: Payer: Self-pay | Admitting: *Deleted

## 2016-09-15 DIAGNOSIS — D0501 Lobular carcinoma in situ of right breast: Secondary | ICD-10-CM

## 2016-09-15 DIAGNOSIS — D0512 Intraductal carcinoma in situ of left breast: Secondary | ICD-10-CM

## 2016-09-16 ENCOUNTER — Ambulatory Visit (HOSPITAL_BASED_OUTPATIENT_CLINIC_OR_DEPARTMENT_OTHER): Payer: 59 | Admitting: Oncology

## 2016-09-16 ENCOUNTER — Other Ambulatory Visit (HOSPITAL_BASED_OUTPATIENT_CLINIC_OR_DEPARTMENT_OTHER): Payer: 59

## 2016-09-16 ENCOUNTER — Other Ambulatory Visit: Payer: Self-pay | Admitting: Oncology

## 2016-09-16 VITALS — BP 129/72 | HR 88 | Temp 98.1°F | Resp 18 | Ht 65.0 in | Wt 191.2 lb

## 2016-09-16 DIAGNOSIS — Z9889 Other specified postprocedural states: Secondary | ICD-10-CM

## 2016-09-16 DIAGNOSIS — D0512 Intraductal carcinoma in situ of left breast: Secondary | ICD-10-CM

## 2016-09-16 DIAGNOSIS — D0501 Lobular carcinoma in situ of right breast: Secondary | ICD-10-CM | POA: Diagnosis not present

## 2016-09-16 LAB — CBC WITH DIFFERENTIAL/PLATELET
BASO%: 0.8 % (ref 0.0–2.0)
BASOS ABS: 0 10*3/uL (ref 0.0–0.1)
EOS ABS: 0.1 10*3/uL (ref 0.0–0.5)
EOS%: 1.3 % (ref 0.0–7.0)
HEMATOCRIT: 35.9 % (ref 34.8–46.6)
HEMOGLOBIN: 12.2 g/dL (ref 11.6–15.9)
LYMPH#: 1.5 10*3/uL (ref 0.9–3.3)
LYMPH%: 35.3 % (ref 14.0–49.7)
MCH: 29.5 pg (ref 25.1–34.0)
MCHC: 33.9 g/dL (ref 31.5–36.0)
MCV: 86.9 fL (ref 79.5–101.0)
MONO#: 0.3 10*3/uL (ref 0.1–0.9)
MONO%: 6.9 % (ref 0.0–14.0)
NEUT#: 2.4 10*3/uL (ref 1.5–6.5)
NEUT%: 55.7 % (ref 38.4–76.8)
PLATELETS: 250 10*3/uL (ref 145–400)
RBC: 4.13 10*6/uL (ref 3.70–5.45)
RDW: 12.8 % (ref 11.2–14.5)
WBC: 4.3 10*3/uL (ref 3.9–10.3)

## 2016-09-16 LAB — COMPREHENSIVE METABOLIC PANEL
ALBUMIN: 3.5 g/dL (ref 3.5–5.0)
ALK PHOS: 61 U/L (ref 40–150)
ALT: 22 U/L (ref 0–55)
ANION GAP: 7 meq/L (ref 3–11)
AST: 20 U/L (ref 5–34)
BUN: 13.8 mg/dL (ref 7.0–26.0)
CALCIUM: 9.7 mg/dL (ref 8.4–10.4)
CHLORIDE: 105 meq/L (ref 98–109)
CO2: 28 mEq/L (ref 22–29)
CREATININE: 0.8 mg/dL (ref 0.6–1.1)
EGFR: 74 mL/min/{1.73_m2} — ABNORMAL LOW (ref >90)
Glucose: 112 mg/dl (ref 70–140)
Potassium: 3.8 mEq/L (ref 3.5–5.1)
Sodium: 141 mEq/L (ref 136–145)
Total Bilirubin: 0.31 mg/dL (ref 0.20–1.20)
Total Protein: 6.8 g/dL (ref 6.4–8.3)

## 2016-09-16 NOTE — Progress Notes (Signed)
[place] [facility]  [phone] [phone]     ID: LATORSHA CURLING DOB: 1955/02/18  MR#: [id number]  IRW#:[id number]  Patient Care Team: [name], MD as PCP - General (Internal Medicine) Michalina Calbert, Virgie Dad, MD as Consulting Physician (Oncology) Erroll Luna, MD as Consulting Physician (General Surgery) Richmond Campbell, MD as Consulting Physician (Gastroenterology) Allyn Kenner, MD (Dermatology) Delice Bison, Charlestine Massed, NP as Nurse Practitioner (Hematology and Oncology) Gery Pray, MD as Consulting Physician (Radiation Oncology) Chauncey Cruel, MD OTHER MD:  CHIEF COMPLAINT: Ductal carcinoma in situ, LCIS  CURRENT TREATMENT: tamoxifen   BREAST CANCER HISTORY: From the original intake note:  Jennifer Rodgers had routine screening mammography in September 2017 suggesting possible areas of distortion in both breasts. On 10/22/2015 she underwent bilateral diagnostic mammography with tomography at the [facility]. The Breast density was category C.  In the right breast there was an area of persistent distortion at the 12:00 region. There was also an area of distortion in the subareolar region of the left breast.  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  The patient's subsequent history is as detailed below   INTERVAL HISTORY: Jennifer Rodgers returns today for follow-up and treatment of her estrogen receptor positive ductal carcinoma in situ. Since her last visit here she completed her radiation treatments. She had decreased energy almost from the very beginning but then estrogen and she finished she felt much better.   She is on tamoxifen, with mild hot flashes and some vaginal wetness as her only side effects. She obtains it at a good  price.  REVIEW OF SYSTEMS: Mindi is not exercising. She is somewhat deconditioned. She can be short of breath when she climbs up the 13th steps to her house. She has had some discomfort in the surgical breast. Aside from these issues a detailed review of systems today was noncontributory  PAST MEDICAL HISTORY: Past Medical History:  Diagnosis Date  . Abdominal pain, unspecified site   . Acute pharyngitis   . Acute upper respiratory infections of unspecified site   . Allergic rhinitis, cause unspecified   . Breast cancer ([facility]) 10/24/2015   Left breast   . Degeneration of cervical intervertebral disc   . Diaphragmatic hernia without mention of obstruction or gangrene   . Dysuria   . Esophageal reflux   . History of radiation therapy 03/18/16-04/15/16   left breast 42.72 Gy in 6 fractions, boost 10 Gy in 5 fractions  . Migraine without aura, without mention of intractable migraine without mention of status migrainosus   . Nausea alone   . Other constipation   . Other malaise and fatigue   . PONV (postoperative nausea and vomiting)   . Screening for depression   . Unspecified hypothyroidism   . Unspecified venous (peripheral) insufficiency   . Urinary frequency     PAST SURGICAL HISTORY: Past Surgical History:  Procedure Laterality Date  . BREAST LUMPECTOMY WITH NEEDLE LOCALIZATION Bilateral 01/31/2016   Procedure: RIGHT BREAST LUMPECTOMY WITH DOUBLE NEEDLE LOCALIZATION, LEFT BREAST NEEDLE LOCALIZED LUMPECTOMY;  Surgeon: Erroll Luna, MD;  Location: [facility];  Service: General;  Laterality: Bilateral;  . COLONOSCOPY  03/03/2011  . LUMBAR LAMINECTOMY  2005  . SALPINGOOPHORECTOMY  2004  . SHOULDER ARTHROSCOPY Left 2008  . THYROID SURGERY  2006   resection of L  nodule    FAMILY HISTORY Family History  Problem Relation Age of Onset  . Hypertension Mother   . Thyroid disease Mother   . Hyperlipidemia Mother   . Cancer Mother        breast  . Hypertension  Sister   . Hyperlipidemia Sister   . Diabetes Sister   . Hypertension Daughter   . Hyperlipidemia Daughter   The patient has no information regarding her father or his side of the family. The patient's mother is alive at age 63. She developed breast cancer in her early 63s. She has also been found to have multiple polyps. She has not had genetics testing. The patient has no brothers. She has 3 half-sisters. There is no history of breast or ovarian cancer in the family otherwise than as noted  GYNECOLOGIC HISTORY:  No LMP recorded. Patient has had a hysterectomy. Menarche age 61, first live birth age 61, the patient is GX P1. She underwent TAH-BSO in 2004. She has been on hormone replacement with estrogen until 2017.  SOCIAL HISTORY:  Braylen works in Programmer, applications. [name] is retired. He used to work as a Paediatric nurse. Daughter [name] lives in [place] where she works in Insurance underwriter out of her home. The patient has 2 grandchildren and 2 great-grandchildren. She is not a Ambulance person.    ADVANCED DIRECTIVES: Not in place   HEALTH MAINTENANCE: Social History  Substance Use Topics  . Smoking status: Never Smoker  . Smokeless tobacco: Never Used  . Alcohol use No     Colonoscopy: April 2018/[facility]  PAP: Status post hysterectomy  Bone density: Never   Allergies  Allergen Reactions  . Prednisone Other (See Comments)    Extreme facial flushing and confusion    Current Outpatient Prescriptions  Medication Sig Dispense Refill  . Omega-3 Fatty Acids (FISH OIL CONCENTRATE PO) Take 1 capsule by mouth daily.     . pantoprazole (PROTONIX) 40 MG tablet Take 40 mg by mouth daily.    Marland Kitchen SYNTHROID 100 MCG tablet Take 1 tablet by mouth daily.    . tamoxifen (NOLVADEX) 20 MG tablet Take 1 tablet (20 mg total) by mouth daily. 90 tablet 4  . vitamin B-12 (CYANOCOBALAMIN) 1000 MCG tablet Take 1,000 mcg by mouth 2 (two) times daily.    . vitamin C (ASCORBIC ACID)  250 MG tablet Take 250 mg by mouth 2 (two) times daily.    Marland Kitchen VITAMIN D, CHOLECALCIFEROL, PO Take 1,000 Units by mouth daily.      No current facility-administered medications for this visit.     OBJECTIVE: Middle-aged white womanWho appears stated age  61:   09/16/16 1334  BP: 129/72  Pulse: 88  Resp: 18  Temp: 98.1 F (36.7 C)  SpO2: 98%     Body mass index is 31.82 kg/m.    ECOG FS:0 - Asymptomatic  Sclerae unicteric, EOMs intact Oropharynx clear and moist No cervical or supraclavicular adenopathy Lungs no rales or rhonchi Heart regular rate and rhythm Abd soft, nontender, positive bowel sounds MSK no focal spinal tenderness, no upper extremity lymphedema Neuro: nonfocal, well oriented, appropriate affect Breasts: Shows post bilateral lumpectomies. There is no palpable mass or evidence of disease recurrence in either breast. Both axillae are benign.  LAB RESULTS:  CMP     Component Value Date/Time   NA 140 01/28/2016 0810   NA 142 01/03/2016 1341   K 4.2 01/28/2016 0810   K 3.8 01/03/2016 1341  CL 105 01/28/2016 0810   CO2 28 01/28/2016 0810   CO2 25 01/03/2016 1341   GLUCOSE 100 (H) 01/28/2016 0810   GLUCOSE 103 01/03/2016 1341   BUN 18 01/28/2016 0810   BUN 13.4 01/03/2016 1341   CREATININE 0.97 01/28/2016 0810   CREATININE 0.8 01/03/2016 1341   CALCIUM 9.1 01/28/2016 0810   CALCIUM 9.2 01/03/2016 1341   PROT 6.5 01/28/2016 0810   PROT 6.7 01/03/2016 1341   ALBUMIN 3.6 01/28/2016 0810   ALBUMIN 3.6 01/03/2016 1341   AST 14 (L) 01/28/2016 0810   AST 13 01/03/2016 1341   ALT 15 01/28/2016 0810   ALT 16 01/03/2016 1341   ALKPHOS 49 01/28/2016 0810   ALKPHOS 63 01/03/2016 1341   BILITOT 0.4 01/28/2016 0810   BILITOT 0.31 01/03/2016 1341   GFRNONAA >60 01/28/2016 0810   GFRAA >60 01/28/2016 0810    INo results found for: SPEP, UPEP  Lab Results  Component Value Date   WBC 4.3 09/16/2016   NEUTROABS 2.4 09/16/2016   HGB 12.2 09/16/2016   HCT  35.9 09/16/2016   MCV 86.9 09/16/2016   PLT 250 09/16/2016      Chemistry      Component Value Date/Time   NA 140 01/28/2016 0810   NA 142 01/03/2016 1341   K 4.2 01/28/2016 0810   K 3.8 01/03/2016 1341   CL 105 01/28/2016 0810   CO2 28 01/28/2016 0810   CO2 25 01/03/2016 1341   BUN 18 01/28/2016 0810   BUN 13.4 01/03/2016 1341   CREATININE 0.97 01/28/2016 0810   CREATININE 0.8 01/03/2016 1341      Component Value Date/Time   CALCIUM 9.1 01/28/2016 0810   CALCIUM 9.2 01/03/2016 1341   ALKPHOS 49 01/28/2016 0810   ALKPHOS 63 01/03/2016 1341   AST 14 (L) 01/28/2016 0810   AST 13 01/03/2016 1341   ALT 15 01/28/2016 0810   ALT 16 01/03/2016 1341   BILITOT 0.4 01/28/2016 0810   BILITOT 0.31 01/03/2016 1341       No results found for: LABCA2  No components found for: LABCA125  No results for input(s): INR in the last 168 hours.  Urinalysis No results found for: COLORURINE, APPEARANCEUR, LABSPEC, PHURINE, GLUCOSEU, HGBUR, BILIRUBINUR, KETONESUR, PROTEINUR, UROBILINOGEN, NITRITE, LEUKOCYTESUR   STUDIES: No results found.  ELIGIBLE FOR AVAILABLE RESEARCH PROTOCOL: COMET  ASSESSMENT: 61 y.o. [place] woman status post left breast biopsy 10/24/2015 showing ductal carcinoma in situ, low-grade, estrogen and progesterone receptor positive, involving a complex sclerosing lesion, and LCIS  (a) right sided LCIS also biopsied 11/23/2015  (1) the patient considered the COMET trial but is ineligible because of contralateral LCIS  (2) breast MRI 11/15/2015 showed 2 additional masses in the right breast not seen by prior studies  (a) biopsy of an upper inner quadrant right breast lesion showed lobular carcinoma in situ.  (b) the additional right breast lesion noted in 11/15/2015 was not apparent on the 11/23/2015 MRI  (3) Status post bilateral lumpectomies 01/31/2016, showing  (a) on the right, lobular carcinoma in situ  (b) on the left, ductal carcinoma in situ,  low-grade, with negative margins  (4) adjuvant radiation 03/18/16 - 04/15/16 Site/dose:    1) Left Breast: 42.72 Gy in 16 fractions 2) Left Breast Boost: 10 Gy in 5 fractions   (5) tamoxifen started November 2017, to be continued for total of 5 years  PLAN: Shunta is now 8 months out from definitive surgery for her breast cancer.  She continues on tamoxifen, with good tolerance. The plan will be to continue that for a total of 5 years.  She sees her gynecologist in late September and Dr. Daleen Bo early in the year. She sees Dr. Brantley Stage in June. Accordingly she will see me in October on a yearly basis and since October is a 2 months from now this will be October 2019 when she returns here.  She knows to call for any problems that may develop before that visit.   Chauncey Cruel, MD   09/16/2016 1:46 PM Medical Oncology and Hematology [facility] [address] [place], [place] [address]

## 2016-09-17 DIAGNOSIS — R7301 Impaired fasting glucose: Secondary | ICD-10-CM | POA: Diagnosis not present

## 2016-09-17 DIAGNOSIS — R0789 Other chest pain: Secondary | ICD-10-CM | POA: Diagnosis not present

## 2016-09-17 DIAGNOSIS — E038 Other specified hypothyroidism: Secondary | ICD-10-CM | POA: Diagnosis not present

## 2016-09-17 DIAGNOSIS — C50919 Malignant neoplasm of unspecified site of unspecified female breast: Secondary | ICD-10-CM | POA: Diagnosis not present

## 2016-10-22 ENCOUNTER — Ambulatory Visit
Admission: RE | Admit: 2016-10-22 | Discharge: 2016-10-22 | Disposition: A | Discharge status: Home / Self Care | Payer: 59 | Source: Ambulatory Visit | Attending: Oncology | Admitting: Oncology

## 2016-10-22 DIAGNOSIS — R922 Inconclusive mammogram: Secondary | ICD-10-CM | POA: Diagnosis not present

## 2016-10-22 DIAGNOSIS — Z9889 Other specified postprocedural states: Secondary | ICD-10-CM

## 2016-10-22 HISTORY — DX: Personal history of irradiation: Z92.3

## 2016-10-23 DIAGNOSIS — Z01419 Encounter for gynecological examination (general) (routine) without abnormal findings: Secondary | ICD-10-CM | POA: Diagnosis not present

## 2016-10-23 DIAGNOSIS — Z780 Asymptomatic menopausal state: Secondary | ICD-10-CM | POA: Diagnosis not present

## 2017-03-18 DIAGNOSIS — L821 Other seborrheic keratosis: Secondary | ICD-10-CM | POA: Diagnosis not present

## 2017-03-18 DIAGNOSIS — L820 Inflamed seborrheic keratosis: Secondary | ICD-10-CM | POA: Diagnosis not present

## 2017-03-18 DIAGNOSIS — L570 Actinic keratosis: Secondary | ICD-10-CM | POA: Diagnosis not present

## 2017-03-18 DIAGNOSIS — X32XXXD Exposure to sunlight, subsequent encounter: Secondary | ICD-10-CM | POA: Diagnosis not present

## 2017-03-27 DIAGNOSIS — Z0000 Encounter for general adult medical examination without abnormal findings: Secondary | ICD-10-CM | POA: Diagnosis not present

## 2017-03-27 DIAGNOSIS — R82998 Other abnormal findings in urine: Secondary | ICD-10-CM | POA: Diagnosis not present

## 2017-03-27 DIAGNOSIS — M859 Disorder of bone density and structure, unspecified: Secondary | ICD-10-CM | POA: Diagnosis not present

## 2017-03-27 DIAGNOSIS — R7301 Impaired fasting glucose: Secondary | ICD-10-CM | POA: Diagnosis not present

## 2017-04-01 DIAGNOSIS — R7301 Impaired fasting glucose: Secondary | ICD-10-CM | POA: Diagnosis not present

## 2017-04-01 DIAGNOSIS — Z0000 Encounter for general adult medical examination without abnormal findings: Secondary | ICD-10-CM | POA: Diagnosis not present

## 2017-04-01 DIAGNOSIS — Z1389 Encounter for screening for other disorder: Secondary | ICD-10-CM | POA: Diagnosis not present

## 2017-04-01 DIAGNOSIS — K635 Polyp of colon: Secondary | ICD-10-CM | POA: Diagnosis not present

## 2017-04-01 DIAGNOSIS — M859 Disorder of bone density and structure, unspecified: Secondary | ICD-10-CM | POA: Diagnosis not present

## 2017-05-13 DIAGNOSIS — R0609 Other forms of dyspnea: Secondary | ICD-10-CM | POA: Diagnosis not present

## 2017-05-13 DIAGNOSIS — E038 Other specified hypothyroidism: Secondary | ICD-10-CM | POA: Diagnosis not present

## 2017-05-13 DIAGNOSIS — R002 Palpitations: Secondary | ICD-10-CM | POA: Diagnosis not present

## 2017-05-25 DIAGNOSIS — Z86000 Personal history of in-situ neoplasm of breast: Secondary | ICD-10-CM | POA: Diagnosis not present

## 2017-05-29 ENCOUNTER — Other Ambulatory Visit: Payer: Self-pay | Admitting: Oncology

## 2017-06-01 ENCOUNTER — Other Ambulatory Visit: Payer: Self-pay

## 2017-06-01 DIAGNOSIS — D0512 Intraductal carcinoma in situ of left breast: Secondary | ICD-10-CM

## 2017-06-01 MED ORDER — TAMOXIFEN CITRATE 20 MG PO TABS: 20.0000 mg | ORAL_TABLET | Freq: Every day | ORAL | 2 refills | Status: DC

## 2017-06-02 ENCOUNTER — Other Ambulatory Visit: Payer: Self-pay | Admitting: *Deleted

## 2017-06-02 DIAGNOSIS — D0512 Intraductal carcinoma in situ of left breast: Secondary | ICD-10-CM

## 2017-06-02 MED ORDER — TAMOXIFEN CITRATE 20 MG PO TABS: 20.0000 mg | ORAL_TABLET | Freq: Every day | ORAL | 2 refills | Status: DC

## 2017-06-12 DIAGNOSIS — H2513 Age-related nuclear cataract, bilateral: Secondary | ICD-10-CM | POA: Diagnosis not present

## 2017-09-18 ENCOUNTER — Other Ambulatory Visit: Payer: Self-pay | Admitting: Oncology

## 2017-09-18 DIAGNOSIS — Z9889 Other specified postprocedural states: Secondary | ICD-10-CM

## 2017-10-26 ENCOUNTER — Ambulatory Visit
Admission: RE | Admit: 2017-10-26 | Discharge: 2017-10-26 | Disposition: A | Discharge status: Home / Self Care | Payer: 59 | Source: Ambulatory Visit | Attending: Oncology | Admitting: Oncology

## 2017-10-26 DIAGNOSIS — Z9889 Other specified postprocedural states: Secondary | ICD-10-CM

## 2017-10-26 DIAGNOSIS — R922 Inconclusive mammogram: Secondary | ICD-10-CM | POA: Diagnosis not present

## 2017-10-26 DIAGNOSIS — Z853 Personal history of malignant neoplasm of breast: Secondary | ICD-10-CM | POA: Diagnosis not present

## 2017-10-30 NOTE — Progress Notes (Signed)
[facility]  [phone] Fax:([phone]     ID: ALYZAE HAWKEY DOB: [birth year]  MR#: [id number]  RKY#:[id number]  Patient Care Team: [name], MD as PCP - General (Internal Medicine) Isabeau Mccalla, Virgie Dad, MD as Consulting Physician (Oncology) Erroll Luna, MD as Consulting Physician (General Surgery) Richmond Campbell, MD as Consulting Physician (Gastroenterology) Allyn Kenner, MD (Dermatology) Delice Bison, Charlestine Massed, NP as Nurse Practitioner (Hematology and Oncology) Gery Pray, MD as Consulting Physician (Radiation Oncology) OTHER MD:  CHIEF COMPLAINT: Ductal carcinoma in situ, LCIS  CURRENT TREATMENT: tamoxifen   BREAST CANCER HISTORY: From the original intake note:  Jennifer Rodgers had routine screening mammography in September 2017 suggesting possible areas of distortion in both breasts. On 10/22/2015 she underwent bilateral diagnostic mammography with tomography at the [facility]. The Breast density was category C.  In the right breast there was an area of persistent distortion at the 12:00 region. There was also an area of distortion in the subareolar region of the left breast.  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  The patient's subsequent history is as detailed below   INTERVAL HISTORY: Jennifer Rodgers returns today for follow-up and treatment of her estrogen receptor positive ductal carcinoma in situ. She continues on tamoxifen, with good tolerance. She denies issues with hot flashes. She has increased vaginal discharge that is heavier some days than others. She wears a pad everyday.   Since her last visit, she underwent diagnostic bilateral mammography with CAD and tomography on 10/26/2017 at [facility] showing: breast  density category C. There was no evidence of malignancy.    REVIEW OF SYSTEMS: Jennifer Rodgers reports that she is retiring in 2020, which she is excited for. She continues with her daily activities, but she does not exercise regularly. She denies unusual headaches, visual changes, nausea, vomiting, or dizziness. There has been no unusual cough, phlegm production, or pleurisy. There has been no change in bowel or bladder habits. She denies unexplained fatigue or unexplained weight loss, bleeding, rash, or fever. A detailed review of systems was otherwise stable.    PAST MEDICAL HISTORY: Past Medical History:  Diagnosis Date  . Abdominal pain, unspecified site   . Acute pharyngitis   . Acute upper respiratory infections of unspecified site   . Allergic rhinitis, cause unspecified   . Breast cancer ([facility]) 10/24/2015   Left breast   . Degeneration of cervical intervertebral disc   . Diaphragmatic hernia without mention of obstruction or gangrene   . Dysuria   . Esophageal reflux   . History of radiation therapy 03/18/16-04/15/16   left breast 42.72 Gy in 6 fractions, boost 10 Gy in 5 fractions  . Migraine without aura, without mention of intractable migraine without mention of status migrainosus   . Nausea alone   . Other constipation   . Other malaise and fatigue   . Personal history of radiation therapy 2018   Left Breast Cancer  . PONV (postoperative nausea and vomiting)   . Screening for depression   . Unspecified hypothyroidism   . Unspecified venous (peripheral) insufficiency   . Urinary frequency     PAST SURGICAL HISTORY: Past Surgical History:  Procedure Laterality Date  . BREAST BIOPSY Bilateral 10/24/2015   LCIS  . BREAST BIOPSY Right 11/23/2015   lcis  . BREAST EXCISIONAL BIOPSY  Right 01/2016  . 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  . BREAST LUMPECTOMY WITH NEEDLE LOCALIZATION Bilateral 01/31/2016   Procedure: RIGHT BREAST LUMPECTOMY WITH DOUBLE NEEDLE LOCALIZATION, LEFT  BREAST NEEDLE LOCALIZED LUMPECTOMY;  Surgeon: Erroll Luna, MD;  Location: [facility];  Service: General;  Laterality: Bilateral;  . COLONOSCOPY  03/03/2011  . LUMBAR LAMINECTOMY  2005  . SALPINGOOPHORECTOMY  2004  . SHOULDER ARTHROSCOPY Left 2008  . THYROID SURGERY  2006   resection of L nodule    FAMILY HISTORY Family History  Problem Relation Age of Onset  . Hypertension Mother   . Thyroid disease Mother   . Hyperlipidemia Mother   . Cancer Mother        breast  . Breast cancer Mother 84  . Hypertension Sister   . Hyperlipidemia Sister   . Diabetes Sister   . Hypertension Daughter   . Hyperlipidemia Daughter   The patient has no information regarding her father or his Rodgers of the family. The patient's mother is alive at age 38. She developed breast cancer in her early 47s. She has also been found to have multiple polyps. She has not had genetics testing. The patient has no brothers. She has 3 half-sisters. There is no history of breast or ovarian cancer in the family otherwise than as noted  GYNECOLOGIC HISTORY:  No LMP recorded. Patient has had a hysterectomy. Menarche age 21, first live birth age 60, the patient is GX P1. She underwent TAH-BSO in 2004. She has been on hormone replacement with estrogen until 2017.  SOCIAL HISTORY:  Angelic works in Programmer, applications. Jennifer Rodgers is retired. He used to work as a Paediatric nurse. Daughter Jennifer Rodgers lives in pleasant [place] where she works in Insurance underwriter out of her home. The patient has 2 grandchildren and 2 great-grandchildren. She is not a Ambulance person.    ADVANCED DIRECTIVES: Not in place   HEALTH MAINTENANCE: Social History   Tobacco Use  . Smoking status: Never Smoker  . Smokeless tobacco: Never Used  Substance Use Topics  . Alcohol use: No    Alcohol/week: 0.5 standard drinks    Types: 1 Standard drinks or equivalent per week  . Drug use: No     Colonoscopy: April  2018/[facility]  PAP: Status post hysterectomy  Bone density: Never   Allergies  Allergen Reactions  . Prednisone Other (See Comments)    Extreme facial flushing and confusion    Current Outpatient Medications  Medication Sig Dispense Refill  . Omega-3 Fatty Acids (FISH OIL CONCENTRATE PO) Take 1 capsule by mouth daily.     . pantoprazole (PROTONIX) 40 MG tablet Take 40 mg by mouth daily.    Marland Kitchen SYNTHROID 100 MCG tablet Take 1 tablet by mouth daily.    . tamoxifen (NOLVADEX) 20 MG tablet Take 1 tablet (20 mg total) by mouth daily. 90 tablet 2  . vitamin B-12 (CYANOCOBALAMIN) 1000 MCG tablet Take 1,000 mcg by mouth 2 (two) times daily.    . vitamin C (ASCORBIC ACID) 250 MG tablet Take 250 mg by mouth 2 (two) times daily.    Marland Kitchen VITAMIN D, CHOLECALCIFEROL, PO Take 1,000 Units by mouth daily.      No current facility-administered medications for this visit.     OBJECTIVE: Middle-aged white woman in no acute distress  Vitals:   11/03/17 1316  BP: 117/79  Pulse: 86  Resp: 18  Temp: 98 F (36.7 C)  SpO2: 97%  Body mass index is 31.47 kg/m.    ECOG FS:0 - Asymptomatic  Sclerae unicteric, pupils round and equal No cervical or supraclavicular adenopathy Lungs no rales or rhonchi Heart regular rate and rhythm Abd soft, nontender, positive bowel sounds MSK no focal spinal tenderness, no upper extremity lymphedema Neuro: nonfocal, well oriented, appropriate affect Breasts: Status post bilateral lumpectomies.  There is no evidence of disease recurrence.  Both axillae are benign.  LAB RESULTS:  CMP     Component Value Date/Time   NA 141 11/03/2017 1256   NA 141 09/16/2016 1310   K 4.6 11/03/2017 1256   K 3.8 09/16/2016 1310   CL 105 11/03/2017 1256   CO2 27 11/03/2017 1256   CO2 28 09/16/2016 1310   GLUCOSE 90 11/03/2017 1256   GLUCOSE 112 09/16/2016 1310   BUN 15 11/03/2017 1256   BUN 13.8 09/16/2016 1310   CREATININE 0.90 11/03/2017 1256   CREATININE 0.8 09/16/2016 1310    CALCIUM 9.7 11/03/2017 1256   CALCIUM 9.7 09/16/2016 1310   PROT 6.9 11/03/2017 1256   PROT 6.8 09/16/2016 1310   ALBUMIN 3.8 11/03/2017 1256   ALBUMIN 3.5 09/16/2016 1310   AST 13 (L) 11/03/2017 1256   AST 20 09/16/2016 1310   ALT 14 11/03/2017 1256   ALT 22 09/16/2016 1310   ALKPHOS 62 11/03/2017 1256   ALKPHOS 61 09/16/2016 1310   BILITOT 0.4 11/03/2017 1256   BILITOT 0.31 09/16/2016 1310   GFRNONAA >60 11/03/2017 1256   GFRAA >60 11/03/2017 1256    INo results found for: SPEP, UPEP  Lab Results  Component Value Date   WBC 5.5 11/03/2017   NEUTROABS 2.9 11/03/2017   HGB 12.2 11/03/2017   HCT 37.3 11/03/2017   MCV 89.4 11/03/2017   PLT 251 11/03/2017      Chemistry      Component Value Date/Time   NA 141 11/03/2017 1256   NA 141 09/16/2016 1310   K 4.6 11/03/2017 1256   K 3.8 09/16/2016 1310   CL 105 11/03/2017 1256   CO2 27 11/03/2017 1256   CO2 28 09/16/2016 1310   BUN 15 11/03/2017 1256   BUN 13.8 09/16/2016 1310   CREATININE 0.90 11/03/2017 1256   CREATININE 0.8 09/16/2016 1310      Component Value Date/Time   CALCIUM 9.7 11/03/2017 1256   CALCIUM 9.7 09/16/2016 1310   ALKPHOS 62 11/03/2017 1256   ALKPHOS 61 09/16/2016 1310   AST 13 (L) 11/03/2017 1256   AST 20 09/16/2016 1310   ALT 14 11/03/2017 1256   ALT 22 09/16/2016 1310   BILITOT 0.4 11/03/2017 1256   BILITOT 0.31 09/16/2016 1310       No results found for: LABCA2  No components found for: LABCA125  No results for input(s): INR in the last 168 hours.  Urinalysis No results found for: COLORURINE, APPEARANCEUR, LABSPEC, PHURINE, GLUCOSEU, HGBUR, BILIRUBINUR, KETONESUR, PROTEINUR, UROBILINOGEN, NITRITE, LEUKOCYTESUR   STUDIES: Mm Diag Breast Tomo Bilateral  Result Date: 10/26/2017 CLINICAL DATA:  62 year old patient presents for routine annual exam. History of left breast lumpectomy for DCIS in January 2018. History of surgical excision of the right breast for LCIS. EXAM: DIGITAL  DIAGNOSTIC BILATERAL MAMMOGRAM WITH CAD AND TOMO COMPARISON:  Previous exam(s). ACR Breast Density Category c: The breast tissue is heterogeneously dense, which may obscure small masses. FINDINGS: There are stable lumpectomy changes in the retroareolar left breast and stable excisional biopsy changes in the upper central right breast posteriorly. No mass,  nonsurgical distortion, or suspicious microcalcification is identified in either breast to suggest malignancy. Mammographic images were processed with CAD. IMPRESSION: Stable postsurgical changes bilaterally. No evidence of malignancy in either breast. RECOMMENDATION: Diagnostic mammogram is suggested in 1 year. (Code:[id number]-B-01Y) I have discussed the findings and recommendations with the patient. Results were also provided in writing at the conclusion of the visit. If applicable, a reminder letter will be sent to the patient regarding the next appointment. BI-RADS CATEGORY  2: Benign. Electronically Signed   By: Curlene Dolphin M.D.   On: 10/26/2017 12:06    ELIGIBLE FOR AVAILABLE RESEARCH PROTOCOL: COMET  ASSESSMENT: 62 y.o. [place] woman status post left breast biopsy 10/24/2015 showing ductal carcinoma in situ, low-grade, estrogen and progesterone receptor positive, involving a complex sclerosing lesion, and LCIS  (a) right sided LCIS also biopsied 11/23/2015  (1) the patient considered the COMET trial but is ineligible because of contralateral LCIS  (2) breast MRI 11/15/2015 showed 2 additional masses in the right breast not seen by prior studies  (a) biopsy of an upper inner quadrant right breast lesion showed lobular carcinoma in situ.  (b) the additional right breast lesion noted in 11/15/2015 was not apparent on the 11/23/2015 MRI  (3) Status post bilateral lumpectomies 01/31/2016, showing  (a) on the right, lobular carcinoma in situ  (b) on the left, ductal carcinoma in situ, low-grade, with negative margins  (4) adjuvant radiation  03/18/16 - 04/15/16 Site/dose:    1) Left Breast: 42.72 Gy in 16 fractions 2) Left Breast Boost: 10 Gy in 5 fractions   (5) tamoxifen started November 2017, to be continued for total of 5 years  PLAN: Jennell will soon be 2 years out from definitive surgery for breast cancer with no evidence of disease recurrence.  This is favorable.  She is tolerating tamoxifen well except for problems with vaginal discharge.  Today we discussed potentially switching to anastrozole or a similar drug.  However she does not want to switch once that of symptoms for another and it is true that anastrozole is associated with significant vaginal dryness problems  Accordingly the plan continues to be for tamoxifen to continue for 5 years  She will see me again a year from now.  She knows to call for any issues that may develop before that visit.   Joeziah Voit, Virgie Dad, MD  11/03/17 1:50 PM Medical Oncology and Hematology [facility] [address] [place], [place] [address] Tel. [phone]    Fax. [phone]  Alice Rieger, am acting as scribe for Chauncey Cruel MD.  I, Lurline Del MD, have reviewed the above documentation for accuracy and completeness, and I agree with the above.

## 2017-11-02 ENCOUNTER — Other Ambulatory Visit: Payer: Self-pay

## 2017-11-02 DIAGNOSIS — D0512 Intraductal carcinoma in situ of left breast: Secondary | ICD-10-CM

## 2017-11-02 DIAGNOSIS — D0501 Lobular carcinoma in situ of right breast: Secondary | ICD-10-CM

## 2017-11-03 ENCOUNTER — Inpatient Hospital Stay: Payer: 59 | Attending: Oncology | Admitting: Oncology

## 2017-11-03 ENCOUNTER — Inpatient Hospital Stay: Payer: 59

## 2017-11-03 VITALS — BP 117/79 | HR 86 | Temp 98.0°F | Resp 18 | Ht 65.0 in | Wt 189.1 lb

## 2017-11-03 DIAGNOSIS — Z79899 Other long term (current) drug therapy: Secondary | ICD-10-CM | POA: Diagnosis not present

## 2017-11-03 DIAGNOSIS — D0512 Intraductal carcinoma in situ of left breast: Secondary | ICD-10-CM | POA: Diagnosis not present

## 2017-11-03 DIAGNOSIS — D0501 Lobular carcinoma in situ of right breast: Secondary | ICD-10-CM

## 2017-11-03 DIAGNOSIS — N898 Other specified noninflammatory disorders of vagina: Secondary | ICD-10-CM | POA: Diagnosis not present

## 2017-11-03 LAB — CBC WITH DIFFERENTIAL (CANCER CENTER ONLY)
Abs Immature Granulocytes: 0.02 10*3/uL (ref 0.00–0.07)
BASOS ABS: 0 10*3/uL (ref 0.0–0.1)
BASOS PCT: 0 %
EOS ABS: 0.1 10*3/uL (ref 0.0–0.5)
EOS PCT: 1 %
HEMATOCRIT: 37.3 % (ref 36.0–46.0)
Hemoglobin: 12.2 g/dL (ref 12.0–15.0)
IMMATURE GRANULOCYTES: 0 %
Lymphocytes Relative: 37 %
Lymphs Abs: 2 10*3/uL (ref 0.7–4.0)
MCH: 29.3 pg (ref 26.0–34.0)
MCHC: 32.7 g/dL (ref 30.0–36.0)
MCV: 89.4 fL (ref 80.0–100.0)
Monocytes Absolute: 0.4 10*3/uL (ref 0.1–1.0)
Monocytes Relative: 8 %
NEUTROS PCT: 54 %
NRBC: 0 % (ref 0.0–0.2)
Neutro Abs: 2.9 10*3/uL (ref 1.7–7.7)
PLATELETS: 251 10*3/uL (ref 150–400)
RBC: 4.17 MIL/uL (ref 3.87–5.11)
RDW: 13 % (ref 11.5–15.5)
WBC Count: 5.5 10*3/uL (ref 4.0–10.5)

## 2017-11-03 LAB — CMP (CANCER CENTER ONLY)
ALK PHOS: 62 U/L (ref 38–126)
ALT: 14 U/L (ref 0–44)
ANION GAP: 9 (ref 5–15)
AST: 13 U/L — ABNORMAL LOW (ref 15–41)
Albumin: 3.8 g/dL (ref 3.5–5.0)
BILIRUBIN TOTAL: 0.4 mg/dL (ref 0.3–1.2)
BUN: 15 mg/dL (ref 8–23)
CALCIUM: 9.7 mg/dL (ref 8.9–10.3)
CO2: 27 mmol/L (ref 22–32)
CREATININE: 0.9 mg/dL (ref 0.44–1.00)
Chloride: 105 mmol/L (ref 98–111)
Glucose, Bld: 90 mg/dL (ref 70–99)
Potassium: 4.6 mmol/L (ref 3.5–5.1)
Sodium: 141 mmol/L (ref 135–145)
TOTAL PROTEIN: 6.9 g/dL (ref 6.5–8.1)

## 2017-11-03 MED ORDER — TAMOXIFEN CITRATE 20 MG PO TABS
20.0000 mg | ORAL_TABLET | Freq: Every day | ORAL | 2 refills | Status: DC
Start: 2017-11-03 — End: 2017-11-30

## 2017-11-13 ENCOUNTER — Other Ambulatory Visit: Payer: Self-pay | Admitting: Oncology

## 2017-11-18 DIAGNOSIS — Z853 Personal history of malignant neoplasm of breast: Secondary | ICD-10-CM | POA: Diagnosis not present

## 2017-11-18 DIAGNOSIS — Z01419 Encounter for gynecological examination (general) (routine) without abnormal findings: Secondary | ICD-10-CM | POA: Diagnosis not present

## 2017-11-18 DIAGNOSIS — Z780 Asymptomatic menopausal state: Secondary | ICD-10-CM | POA: Diagnosis not present

## 2017-11-30 ENCOUNTER — Other Ambulatory Visit: Payer: Self-pay

## 2017-11-30 DIAGNOSIS — D0512 Intraductal carcinoma in situ of left breast: Secondary | ICD-10-CM

## 2017-11-30 MED ORDER — TAMOXIFEN CITRATE 20 MG PO TABS: 20.0000 mg | ORAL_TABLET | Freq: Every day | ORAL | 3 refills | Status: DC

## 2017-12-09 ENCOUNTER — Other Ambulatory Visit: Payer: Self-pay | Admitting: Oncology

## 2017-12-12 ENCOUNTER — Other Ambulatory Visit: Payer: Self-pay | Admitting: Oncology

## 2017-12-28 ENCOUNTER — Telehealth: Payer: Self-pay

## 2017-12-28 MED ORDER — TAMOXIFEN CITRATE 10 MG PO TABS: 10.0000 mg | ORAL_TABLET | Freq: Two times a day (BID) | ORAL | 1 refills | Status: DC

## 2017-12-28 NOTE — Telephone Encounter (Signed)
No entry 

## 2018-01-27 IMAGING — MG STEREOTACTIC VACUUM ASSIST RIGHT
4 series · 4 of 16 positions shown · non-contrast
Comparison: Previous exams.

ADDENDUM:
Please note that coil shaped tissue marker was deployed in the left
subareolar breast, and X shaped tissue marker was deployed in the
right 12 o'clock breast.

Pathology revealed low grade ductal carcinoma in situ involving a
complex sclerosing lesion and lobular carcinoma in situ in the left
breast. The right breast shows lobular carcinoma in situ. This was
found to be concordant by Dr. Pedro Richardo Frengkysetiawan. Pathology results
were discussed with the patient by telephone. The patient reported
doing well after the biopsies with tenderness at the sites. Post
biopsy instructions and care were reviewed and questions were
answered. The patient was encouraged to call The [REDACTED] of
consultation has been arranged with Dr. Mattoussi Neguez at [REDACTED] on October 31, 2015.
Pathology results reported by Hipolito Polanco RN, BSN on 10/26/2015.
CLINICAL DATA: Areas of architectural distortion in bilateral
breasts, left subareolar breast and right 12 o'clock breast.
EXAM:
BILATERAL BREAST STEREOTACTIC CORE NEEDLE BIOPSY

[R ML]
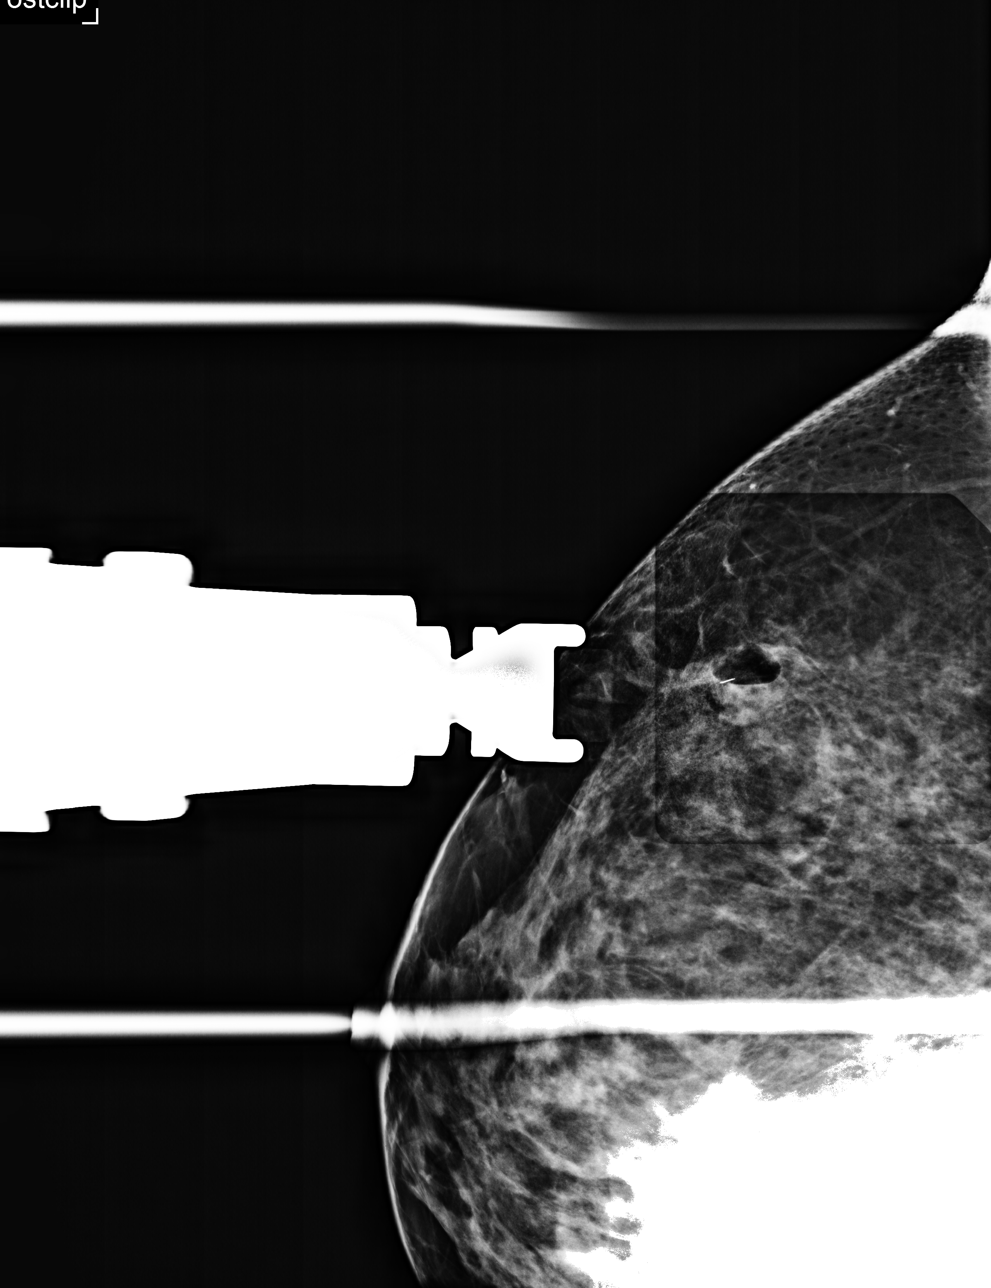

[R ML tomo (1 of 3) · tomo slice 41/82.0]
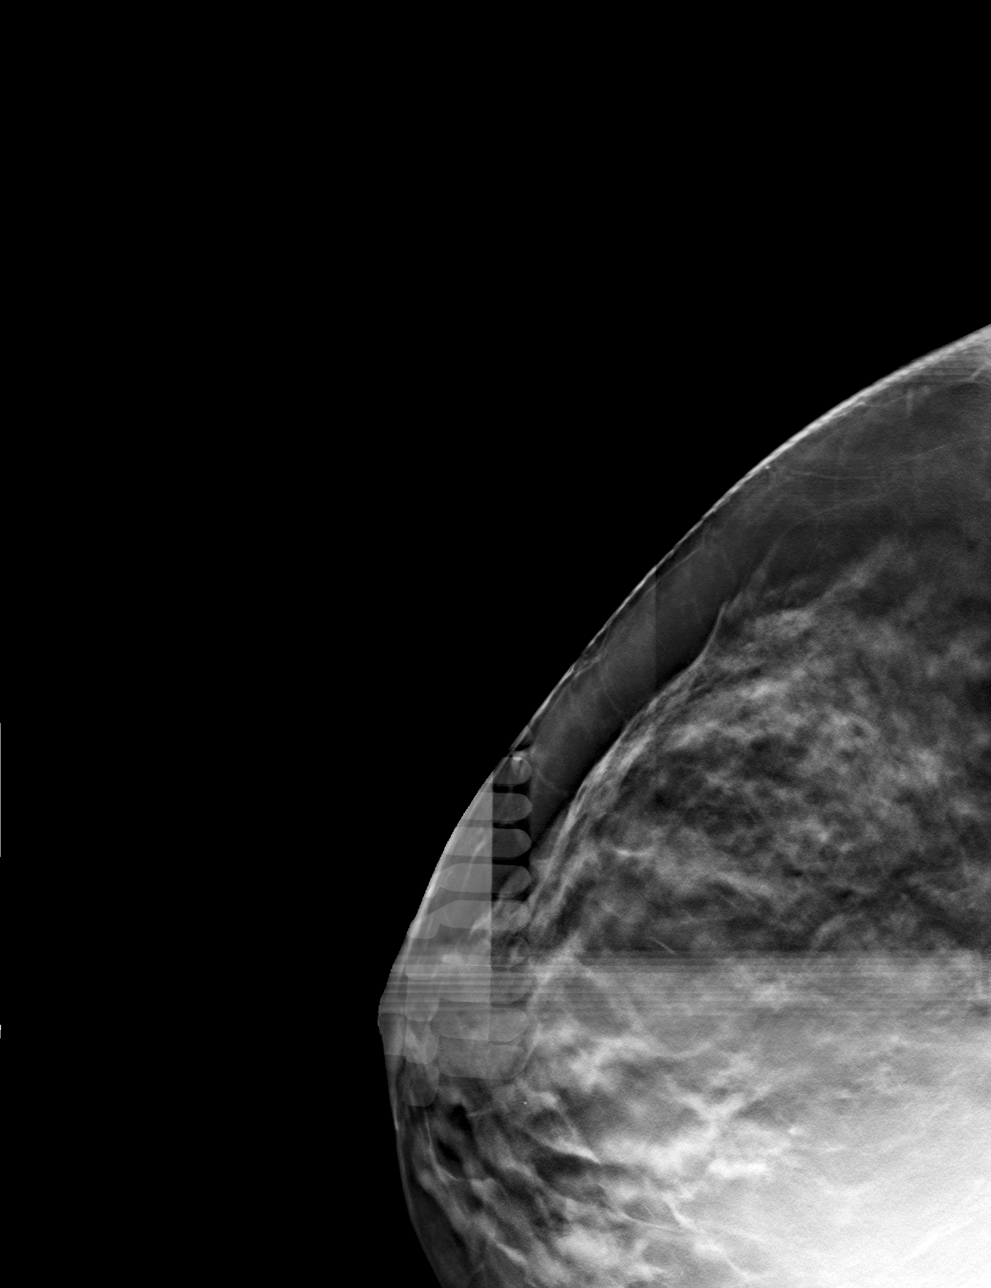

[R ML tomo (2 of 3) · tomo slice 42/83.0]
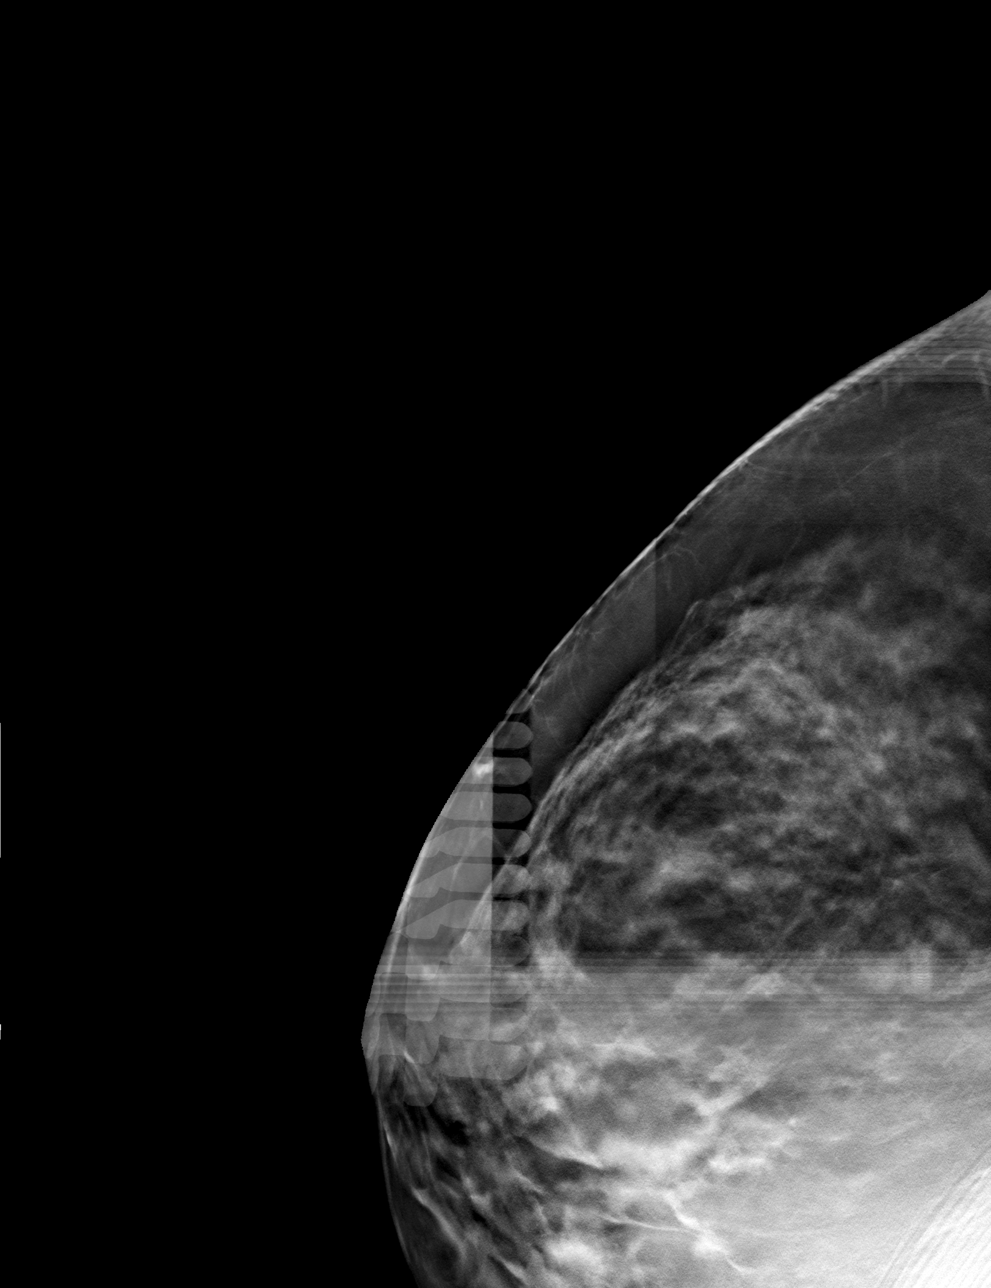

[R ML tomo (3 of 3) · tomo slice 42/83.0]
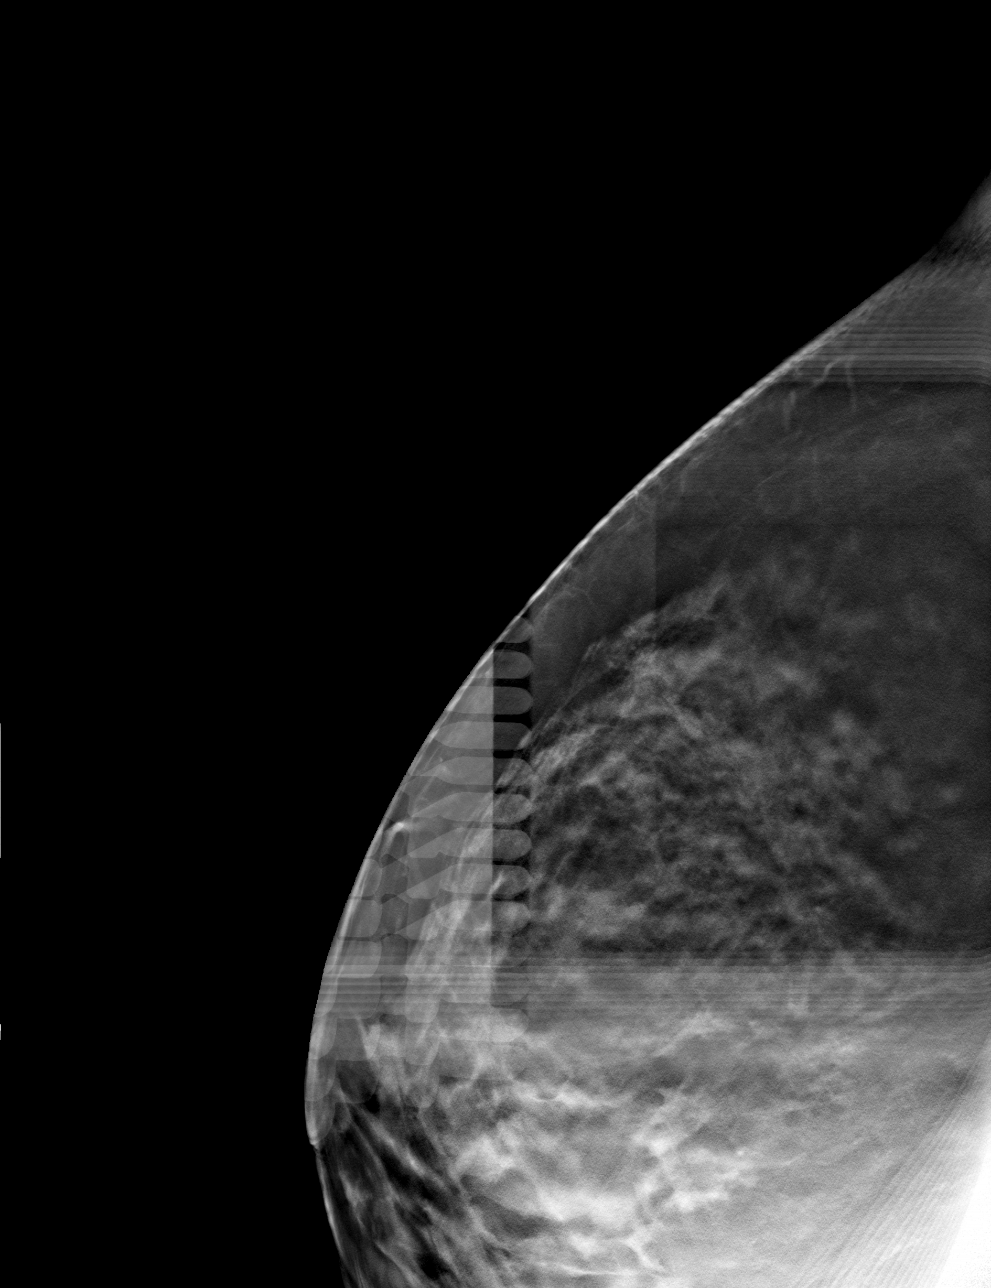

[4 of 16 positions shown; findings below may reference images not displayed]



Using sterile technique and 1% Lidocaine as local anesthetic, under
stereotactic guidance, a 9 gauge vacuum assisted device was used to
perform core needle biopsy of left subareolar breast architectural
distortion using a superior approach.

Next, using sterile technique and 1% Lidocaine as local anesthetic,
under stereotactic guidance, a 9 gauge vacuum assisted device was
used to perform core needle biopsy of right 12 o'clock breast using
a lateral approach.

At the conclusion of the procedure, a coil shaped tissue marker clip
was deployed into the biopsy cavity bilaterally. Follow-up 2-view
mammogram was performed and dictated separately.
IMPRESSION: Stereotactic-guided biopsy of left subareolar breast and right 12
o'clock areas of architectural distortion. No apparent
complications.

## 2018-01-27 IMAGING — MG STEREOTACTIC CORE NEEDLE BIOPSY
4 series · 4 of 16 positions shown · non-contrast
Comparison: Previous exams.

ADDENDUM:
Please note that coil shaped tissue marker was deployed in the left
subareolar breast, and X shaped tissue marker was deployed in the
right 12 o'clock breast.

Pathology revealed low grade ductal carcinoma in situ involving a
complex sclerosing lesion and lobular carcinoma in situ in the left
breast. The right breast shows lobular carcinoma in situ. This was
found to be concordant by Dr. Pedro Richardo Frengk[place]iawan. Pathology results
were discussed with the patient by telephone. The patient reported
doing well after the biopsies with tenderness at the sites. Post
biopsy instructions and care were reviewed and questions were
answered. The patient was encouraged to call The [REDACTED] of
consultation has been arranged with Dr. Mattoussi Neguez at [REDACTED] on October 31, 2015.
Pathology results reported by Hipolito Polanco RN, BSN on 10/26/2015.
CLINICAL DATA: Areas of architectural distortion in bilateral
breasts, left subareolar breast and right 12 o'clock breast.
EXAM:
BILATERAL BREAST STEREOTACTIC CORE NEEDLE BIOPSY

[L CC]
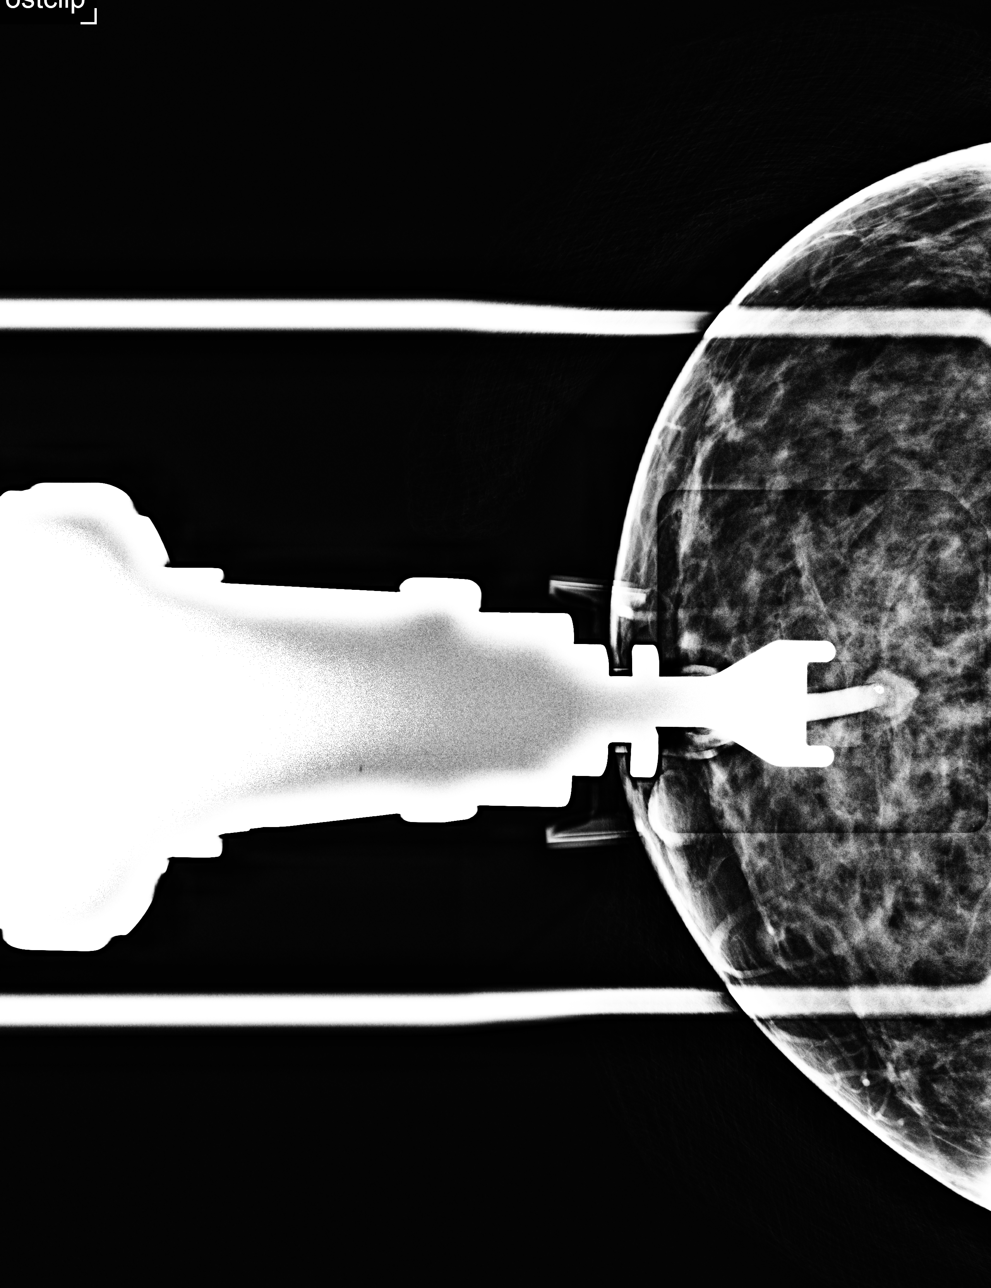

[L CC tomo (1 of 3) · tomo slice 36/71.0]
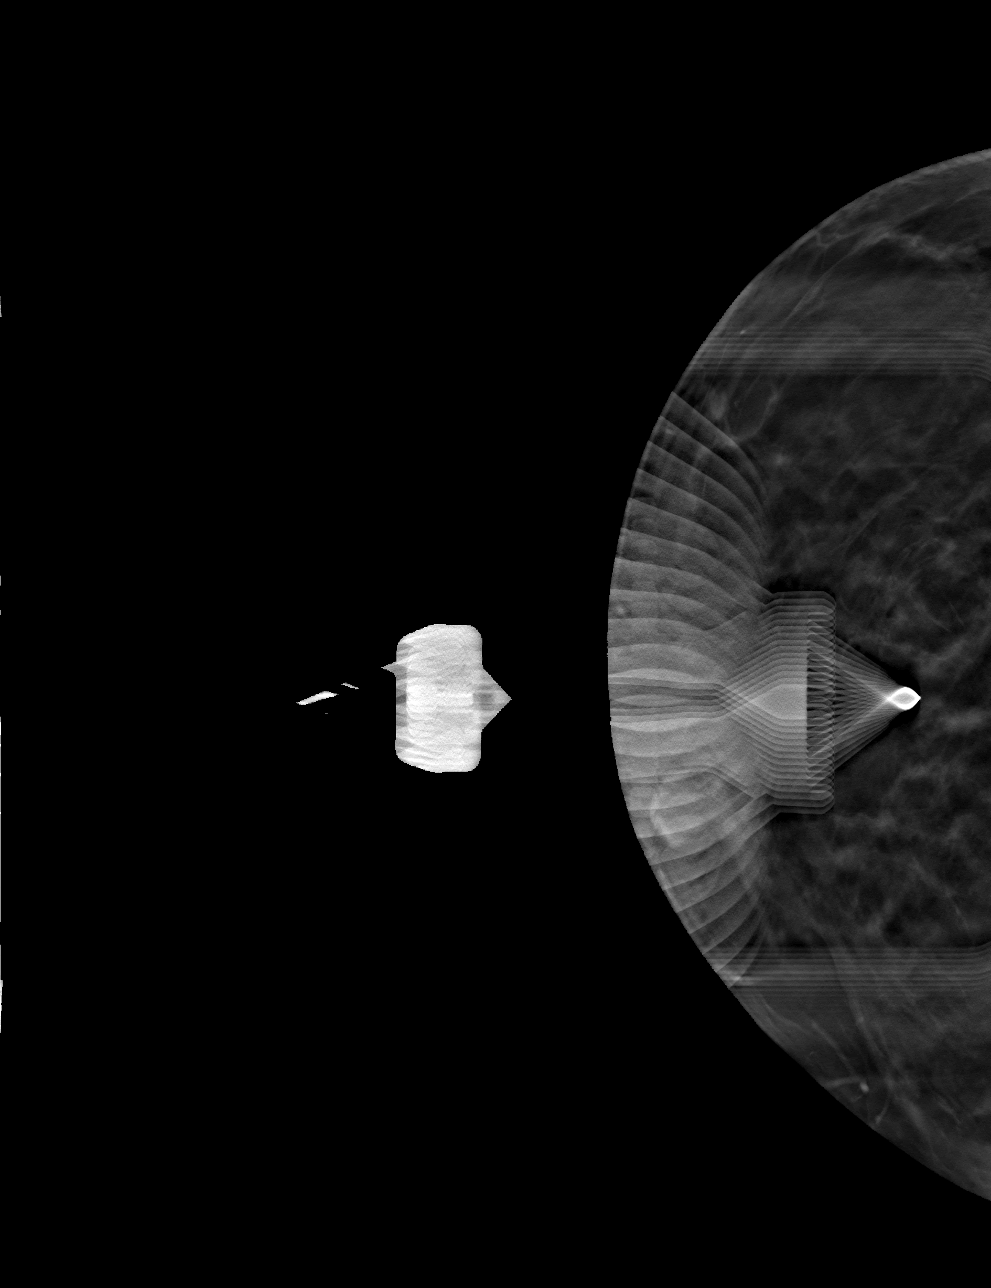

[L CC tomo (2 of 3) · tomo slice 36/71.0]
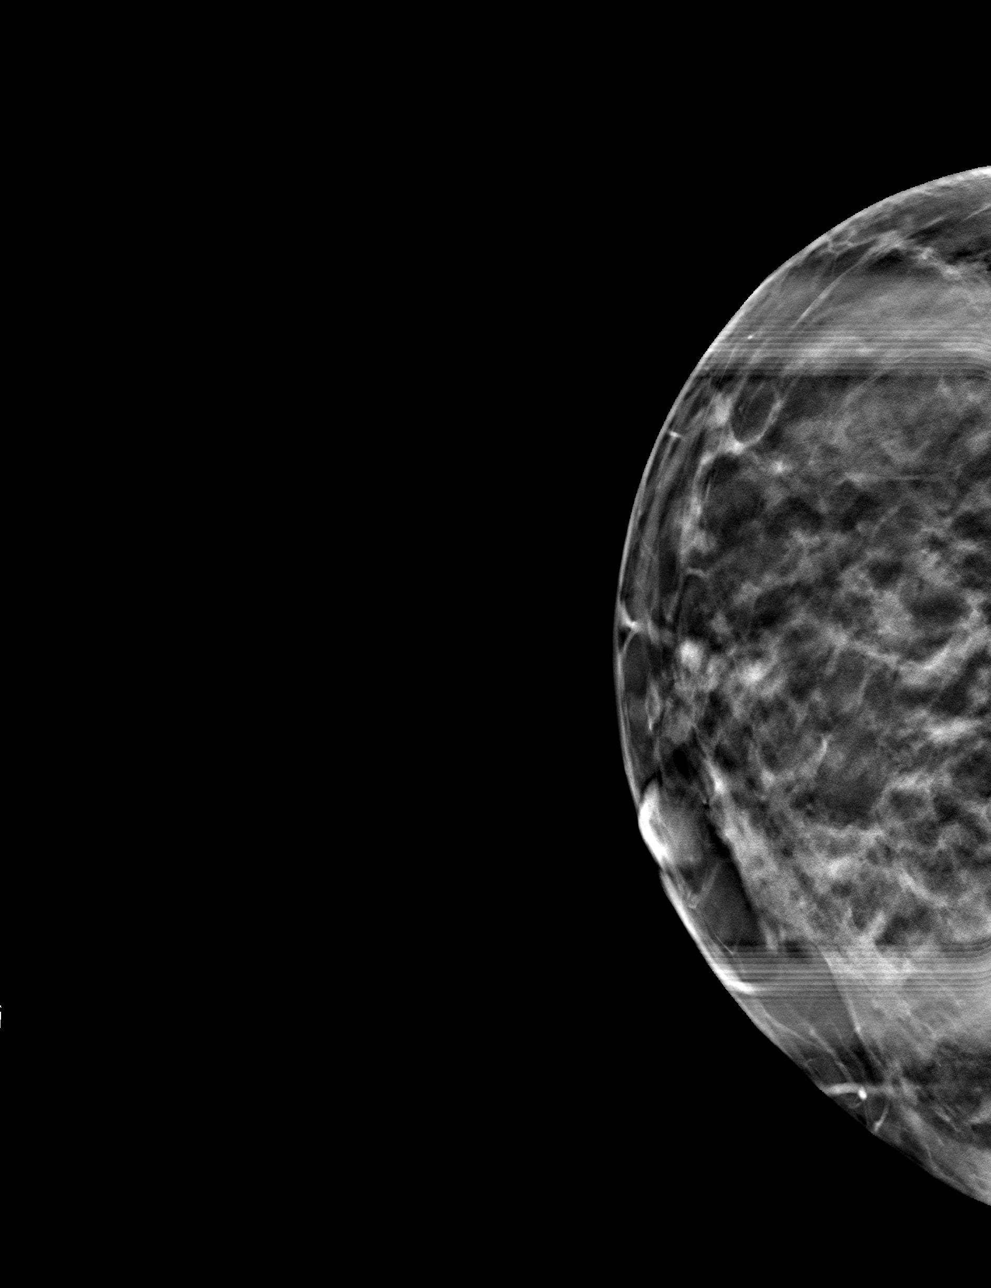

[L CC tomo (3 of 3) · tomo slice 36/71.0]
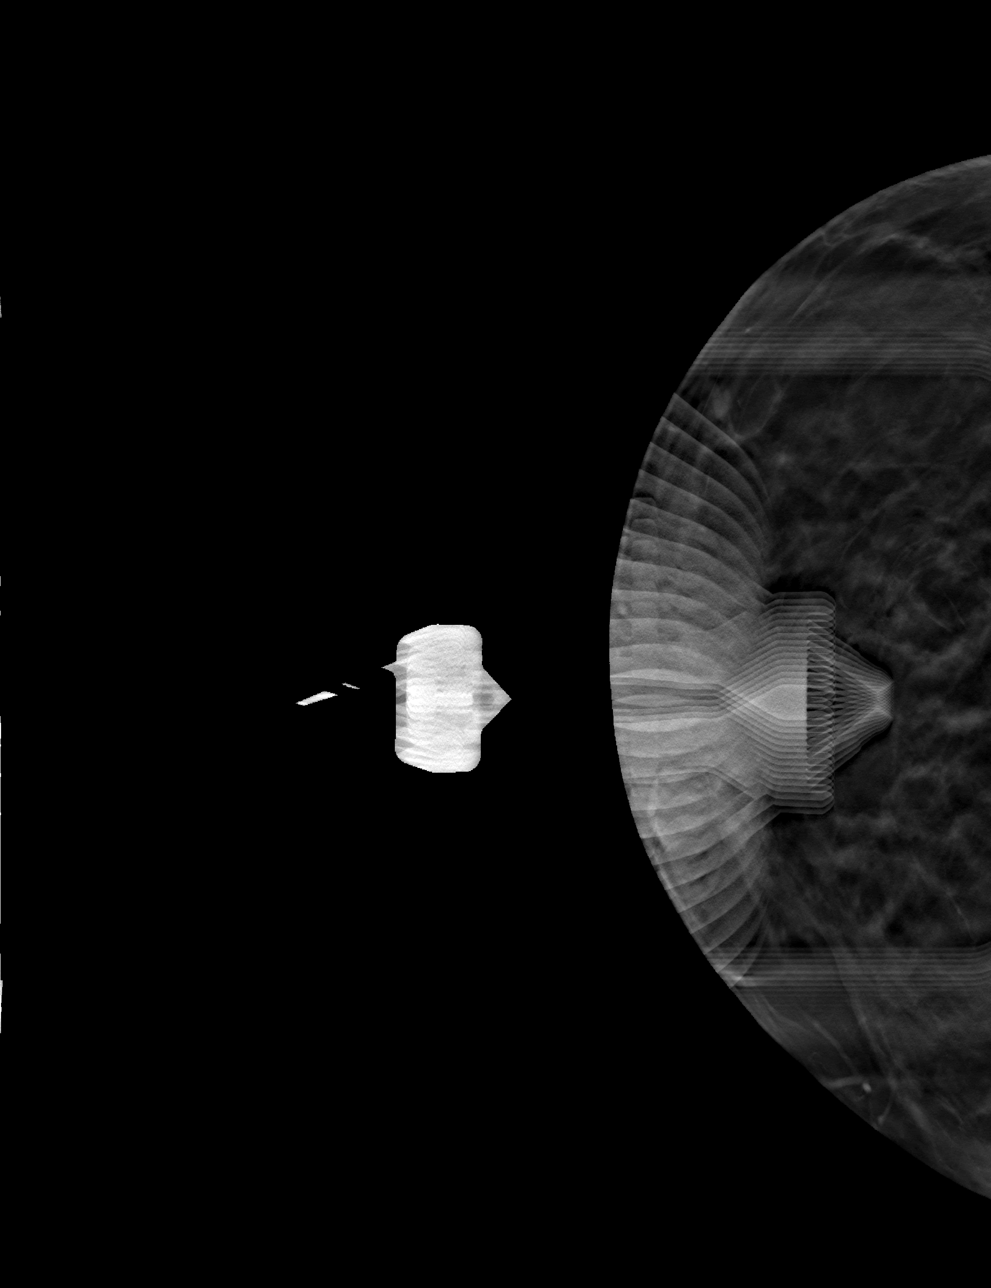

[4 of 16 positions shown; findings below may reference images not displayed]



Using sterile technique and 1% Lidocaine as local anesthetic, under
stereotactic guidance, a 9 gauge vacuum assisted device was used to
perform core needle biopsy of left subareolar breast architectural
distortion using a superior approach.

Next, using sterile technique and 1% Lidocaine as local anesthetic,
under stereotactic guidance, a 9 gauge vacuum assisted device was
used to perform core needle biopsy of right 12 o'clock breast using
a lateral approach.

At the conclusion of the procedure, a coil shaped tissue marker clip
was deployed into the biopsy cavity bilaterally. Follow-up 2-view
mammogram was performed and dictated separately.
IMPRESSION: Stereotactic-guided biopsy of left subareolar breast and right 12
o'clock areas of architectural distortion. No apparent
complications.

## 2018-02-17 DIAGNOSIS — R3121 Asymptomatic microscopic hematuria: Secondary | ICD-10-CM | POA: Diagnosis not present

## 2018-02-17 DIAGNOSIS — N368 Other specified disorders of urethra: Secondary | ICD-10-CM | POA: Diagnosis not present

## 2018-03-10 DIAGNOSIS — R8290 Unspecified abnormal findings in urine: Secondary | ICD-10-CM | POA: Diagnosis not present

## 2018-04-05 DIAGNOSIS — R82998 Other abnormal findings in urine: Secondary | ICD-10-CM | POA: Diagnosis not present

## 2018-04-05 DIAGNOSIS — M859 Disorder of bone density and structure, unspecified: Secondary | ICD-10-CM | POA: Diagnosis not present

## 2018-04-05 DIAGNOSIS — Z0000 Encounter for general adult medical examination without abnormal findings: Secondary | ICD-10-CM | POA: Diagnosis not present

## 2018-04-05 DIAGNOSIS — E038 Other specified hypothyroidism: Secondary | ICD-10-CM | POA: Diagnosis not present

## 2018-04-12 DIAGNOSIS — M8580 Other specified disorders of bone density and structure, unspecified site: Secondary | ICD-10-CM | POA: Diagnosis not present

## 2018-04-12 DIAGNOSIS — R7301 Impaired fasting glucose: Secondary | ICD-10-CM | POA: Diagnosis not present

## 2018-04-12 DIAGNOSIS — K635 Polyp of colon: Secondary | ICD-10-CM | POA: Diagnosis not present

## 2018-04-12 DIAGNOSIS — Z0000 Encounter for general adult medical examination without abnormal findings: Secondary | ICD-10-CM | POA: Diagnosis not present

## 2018-06-07 DIAGNOSIS — Z86000 Personal history of in-situ neoplasm of breast: Secondary | ICD-10-CM | POA: Diagnosis not present

## 2018-08-01 ENCOUNTER — Other Ambulatory Visit: Payer: Self-pay | Admitting: Oncology

## 2018-09-21 ENCOUNTER — Other Ambulatory Visit: Payer: Self-pay | Admitting: *Deleted

## 2018-09-21 ENCOUNTER — Other Ambulatory Visit: Payer: Self-pay | Admitting: Oncology

## 2018-09-21 DIAGNOSIS — Z853 Personal history of malignant neoplasm of breast: Secondary | ICD-10-CM

## 2018-10-11 ENCOUNTER — Other Ambulatory Visit: Payer: Self-pay | Admitting: Oncology

## 2018-10-21 ENCOUNTER — Telehealth: Payer: Self-pay | Admitting: Oncology

## 2018-10-21 NOTE — Telephone Encounter (Signed)
GM PAL 10/19 moved appointment to 10/23. Confirmed with patient.

## 2018-11-02 ENCOUNTER — Other Ambulatory Visit: Payer: Self-pay

## 2018-11-02 ENCOUNTER — Ambulatory Visit
Admission: RE | Admit: 2018-11-02 | Discharge: 2018-11-02 | Disposition: A | Discharge status: Home / Self Care | Payer: 59 | Source: Ambulatory Visit | Attending: Oncology | Admitting: Oncology

## 2018-11-02 DIAGNOSIS — Z853 Personal history of malignant neoplasm of breast: Secondary | ICD-10-CM

## 2018-11-08 ENCOUNTER — Other Ambulatory Visit: Payer: 59

## 2018-11-08 ENCOUNTER — Ambulatory Visit: Payer: 59 | Admitting: Oncology

## 2018-11-11 ENCOUNTER — Other Ambulatory Visit: Payer: Self-pay

## 2018-11-11 DIAGNOSIS — D0501 Lobular carcinoma in situ of right breast: Secondary | ICD-10-CM

## 2018-11-11 DIAGNOSIS — D0512 Intraductal carcinoma in situ of left breast: Secondary | ICD-10-CM

## 2018-11-11 NOTE — Progress Notes (Signed)
[place]  Telephone:(336) [phone] Fax:(336) [phone]     ID: Jennifer Rodgers DOB: [birth year]  MR#: [id number]  [id number]  Patient Care Team: [name], MD as PCP - General (Internal Medicine) Harleigh Civello, Virgie Dad, MD as Consulting Physician (Oncology) Erroll Luna, MD as Consulting Physician (General Surgery) Richmond Campbell, MD as Consulting Physician (Gastroenterology) Allyn Kenner, MD (Dermatology) Delice Bison, Charlestine Massed, NP as Nurse Practitioner (Hematology and Oncology) Gery Pray, MD as Consulting Physician (Radiation Oncology) OTHER MD:  CHIEF COMPLAINT: Ductal carcinoma in situ, LCIS  CURRENT TREATMENT: tamoxifen   INTERVAL HISTORY: Jennifer Rodgers returns today for follow-up of her estrogen receptor positive ductal carcinoma in situ.   She continues on tamoxifen, with good tolerance.  Hot flashes and vaginal wetness are not major issues.  She obtains it under good price.  Since her last visit, she underwent bilateral diagnostic mammography with tomography at [facility] on 11/02/2018 showing: breast density category C; no evidence of malignancy in either breast.    REVIEW OF SYSTEMS: Jennifer Rodgers had a wonderful exercise program including walking almost 3 miles a day but since she started working from home and lost the structures she has not been doing much exercise.  She is now just beginning to get back to work and hopes to regain that.  Aside from that a detailed review of systems today is entirely benign.   BREAST CANCER HISTORY: From the original intake note:  Jennifer Rodgers had routine screening mammography in September 2017 suggesting possible areas of distortion in both breasts. On 10/22/2015 she underwent bilateral diagnostic mammography with tomography at the [facility]. The Breast density was category C.  In the right breast there was an area of persistent distortion at the 12:00 region. There was also an area of distortion in the  subareolar region of the left breast.  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  The patient's subsequent history is as detailed below   PAST MEDICAL HISTORY: Past Medical History:  Diagnosis Date  . Abdominal pain, unspecified site   . Acute pharyngitis   . Acute upper respiratory infections of unspecified site   . Allergic rhinitis, cause unspecified   . Breast cancer ([facility]) 10/24/2015   Left breast   . Degeneration of cervical intervertebral disc   . Diaphragmatic hernia without mention of obstruction or gangrene   . Dysuria   . Esophageal reflux   . History of radiation therapy 03/18/16-04/15/16   left breast 42.72 Gy in 6 fractions, boost 10 Gy in 5 fractions  . Migraine without aura, without mention of intractable migraine without mention of status migrainosus   . Nausea alone   . Other constipation   . Other malaise and fatigue   . Personal history of radiation therapy 2018   Left Breast Cancer  . PONV (postoperative nausea and vomiting)   . Screening for depression   . Unspecified hypothyroidism   . Unspecified venous (peripheral) insufficiency   . Urinary frequency     PAST SURGICAL HISTORY: Past Surgical History:  Procedure Laterality Date  . BREAST BIOPSY Bilateral 10/24/2015   LCIS  . BREAST BIOPSY Right 11/23/2015   lcis  . BREAST EXCISIONAL BIOPSY Right 01/2016  . 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  . BREAST LUMPECTOMY WITH NEEDLE LOCALIZATION Bilateral 01/31/2016   Procedure: RIGHT  BREAST LUMPECTOMY WITH DOUBLE NEEDLE LOCALIZATION, LEFT BREAST NEEDLE LOCALIZED LUMPECTOMY;  Surgeon: Erroll Luna, MD;  Location: [facility];  Service: General;  Laterality: Bilateral;  . COLONOSCOPY  03/03/2011  . LUMBAR LAMINECTOMY  2005  .  SALPINGOOPHORECTOMY  2004  . SHOULDER ARTHROSCOPY Left 2008  . THYROID SURGERY  2006   resection of L nodule    FAMILY HISTORY Family History  Problem Relation Age of Onset  . Hypertension Mother   . Thyroid disease Mother   . Hyperlipidemia Mother   . Breast cancer Mother 25  . Hypertension Sister   . Hyperlipidemia Sister   . Diabetes Sister   . Hypertension Daughter   . Hyperlipidemia Daughter   The patient has no information regarding her father or his Rodgers of the family. The patient's mother is alive at age 85. She developed breast cancer in her early 3s. She has also been found to have multiple polyps. She has not had genetics testing. The patient has no brothers. She has 3 half-sisters. There is no history of breast or ovarian cancer in the family otherwise than as noted   GYNECOLOGIC HISTORY:  No LMP recorded. Patient has had a hysterectomy. Menarche age 56, first live birth age 57, the patient is GX P1. She underwent TAH-BSO in 2004. She has been on hormone replacement with estrogen until 2017.   SOCIAL HISTORY:  Jennifer Rodgers works in Programmer, applications. Jennifer Rodgers is retired. He used to work as a Paediatric nurse. Daughter Jennifer Rodgers lives in [place] where she works in Insurance underwriter out of her home. The patient has 2 grandchildren and 2 great-grandchildren. She is not a Ambulance person.    ADVANCED DIRECTIVES: Not in place   HEALTH MAINTENANCE: Social History   Tobacco Use  . Smoking status: Never Smoker  . Smokeless tobacco: Never Used  Substance Use Topics  . Alcohol use: No    Alcohol/week: 0.5 standard drinks    Types: 1 Standard drinks or equivalent per week  . Drug use: No     Colonoscopy: April 2018/[facility]  PAP: Status post hysterectomy  Bone density: Never   Allergies  Allergen Reactions  . Prednisone Other (See Comments)    Extreme facial flushing and confusion    Current Outpatient Medications  Medication Sig Dispense Refill  .  Omega-3 Fatty Acids (FISH OIL CONCENTRATE PO) Take 1 capsule by mouth daily.     . pantoprazole (PROTONIX) 40 MG tablet Take 40 mg by mouth daily.    Marland Kitchen SYNTHROID 100 MCG tablet Take 1 tablet by mouth daily.    . tamoxifen (NOLVADEX) 10 MG tablet TAKE 1 TABLET BY MOUTH TWICE A DAY 180 tablet 1  . tamoxifen (NOLVADEX) 20 MG tablet Take 1 tablet (20 mg total) by mouth daily. 90 tablet 3  . vitamin B-12 (CYANOCOBALAMIN) 1000 MCG tablet Take 1,000 mcg by mouth 2 (two) times daily.    . vitamin C (ASCORBIC ACID) 250 MG tablet Take 250 mg by mouth 2 (two) times daily.    Marland Kitchen VITAMIN D, CHOLECALCIFEROL, PO Take 1,000 Units by mouth daily.      No current facility-administered medications for this visit.     OBJECTIVE: Middle-aged white woman who appears well  Vitals:   11/12/18 1259  BP: 116/77  Pulse: 79  Resp: 18  Temp: 98.3 F (36.8 C)  SpO2: 99%     Body mass index is 31.25 kg/m.    ECOG FS:0 - Asymptomatic  Sclerae unicteric,  EOMs intact Wearing a mask No cervical or supraclavicular adenopathy Lungs no rales or rhonchi Heart regular rate and rhythm Abd soft, nontender, positive bowel sounds MSK no focal spinal tenderness, no upper extremity lymphedema Neuro: nonfocal, well oriented, appropriate affect Breasts: She is bilateral mastectomies, but there is no evidence of active disease in either breast, no skin or nipple findings of concern.  Both axillae are benign.   LAB RESULTS:  CMP     Component Value Date/Time   NA 139 11/12/2018 1247   NA 141 09/16/2016 1310   K 4.0 11/12/2018 1247   K 3.8 09/16/2016 1310   CL 104 11/12/2018 1247   CO2 27 11/12/2018 1247   CO2 28 09/16/2016 1310   GLUCOSE 90 11/12/2018 1247   GLUCOSE 112 09/16/2016 1310   BUN 15 11/12/2018 1247   BUN 13.8 09/16/2016 1310   CREATININE 0.86 11/12/2018 1247   CREATININE 0.8 09/16/2016 1310   CALCIUM 9.4 11/12/2018 1247   CALCIUM 9.7 09/16/2016 1310   PROT 6.7 11/12/2018 1247   PROT 6.8 09/16/2016  1310   ALBUMIN 3.8 11/12/2018 1247   ALBUMIN 3.5 09/16/2016 1310   AST 16 11/12/2018 1247   AST 20 09/16/2016 1310   ALT 15 11/12/2018 1247   ALT 22 09/16/2016 1310   ALKPHOS 61 11/12/2018 1247   ALKPHOS 61 09/16/2016 1310   BILITOT 0.5 11/12/2018 1247   BILITOT 0.31 09/16/2016 1310   GFRNONAA >60 11/12/2018 1247   GFRAA >60 11/12/2018 1247    INo results found for: SPEP, UPEP  Lab Results  Component Value Date   WBC 5.8 11/12/2018   NEUTROABS 3.5 11/12/2018   HGB 12.3 11/12/2018   HCT 37.1 11/12/2018   MCV 89.0 11/12/2018   PLT 248 11/12/2018      Chemistry      Component Value Date/Time   NA 139 11/12/2018 1247   NA 141 09/16/2016 1310   K 4.0 11/12/2018 1247   K 3.8 09/16/2016 1310   CL 104 11/12/2018 1247   CO2 27 11/12/2018 1247   CO2 28 09/16/2016 1310   BUN 15 11/12/2018 1247   BUN 13.8 09/16/2016 1310   CREATININE 0.86 11/12/2018 1247   CREATININE 0.8 09/16/2016 1310      Component Value Date/Time   CALCIUM 9.4 11/12/2018 1247   CALCIUM 9.7 09/16/2016 1310   ALKPHOS 61 11/12/2018 1247   ALKPHOS 61 09/16/2016 1310   AST 16 11/12/2018 1247   AST 20 09/16/2016 1310   ALT 15 11/12/2018 1247   ALT 22 09/16/2016 1310   BILITOT 0.5 11/12/2018 1247   BILITOT 0.31 09/16/2016 1310       No results found for: LABCA2  No components found for: LABCA125  No results for input(s): INR in the last 168 hours.  Urinalysis No results found for: COLORURINE, APPEARANCEUR, LABSPEC, PHURINE, GLUCOSEU, HGBUR, BILIRUBINUR, KETONESUR, PROTEINUR, UROBILINOGEN, NITRITE, LEUKOCYTESUR   STUDIES: Mm Diag Breast Tomo Bilateral  Result Date: 11/02/2018 CLINICAL DATA:  Patient with history of left breast lumpectomy in 2018 and benign surgical excision of the right breast. EXAM: DIGITAL DIAGNOSTIC BILATERAL MAMMOGRAM WITH CAD AND TOMO COMPARISON:  Previous exam(s). ACR Breast Density Category c: The breast tissue is heterogeneously dense, which may obscure small masses.  FINDINGS: Stable postlumpectomy changes left breast. Stable excisional biopsy changes right breast. No new masses, calcifications or nonsurgical distortion identified within either breast. Mammographic images were processed with CAD. IMPRESSION: No mammographic evidence for malignancy. RECOMMENDATION: Bilateral diagnostic mammography 1 year.  I have discussed the findings and recommendations with the patient. If applicable, a reminder letter will be sent to the patient regarding the next appointment. BI-RADS CATEGORY  2: Benign. Electronically Signed   By: Lovey Newcomer M.D.   On: 11/02/2018 11:01    ELIGIBLE FOR AVAILABLE RESEARCH PROTOCOL: COMET  ASSESSMENT: 63 y.o. [place] woman status post left breast biopsy 10/24/2015 showing ductal carcinoma in situ, low-grade, estrogen and progesterone receptor positive, involving a complex sclerosing lesion, and LCIS  (a) right sided LCIS also biopsied 11/23/2015  (1) the patient considered the COMET trial but is ineligible because of contralateral LCIS  (2) breast MRI 11/15/2015 showed 2 additional masses in the right breast not seen by prior studies  (a) biopsy of an upper inner quadrant right breast lesion showed lobular carcinoma in situ.  (b) the additional right breast lesion noted in 11/15/2015 was not apparent on the 11/23/2015 MRI  (3) Status post bilateral lumpectomies 01/31/2016, showing  (a) on the right, lobular carcinoma in situ  (b) on the left, ductal carcinoma in situ, low-grade, with negative margins  (4) adjuvant radiation 03/18/16 - 04/15/16 Site/dose:    1) Left Breast: 42.72 Gy in 16 fractions 2) Left Breast Boost: 10 Gy in 5 fractions   (5) tamoxifen started November 2017, to be continued for total of 5 years  PLAN: Trinetta is now just about 3 years into her tamoxifen treatment with excellent tolerance.  We reviewed her mammography and the fact that her breasts are still a bit on the dense Rodgers.  Hopefully this will change  over the next couple of years.  She takes tamoxifen between 10 and 20 mg a day depending on what she remembers.  She does not notice a difference between one dose or the other.  Apparently whenever we prescribe 20 mg tablets when it gets refilled she gets the 10 mg.  She will continue on tamoxifen to a total of 5 years.  She will see me again a year from now.  She knows to call for any other issue that may develop before then.   Jasia Hiltunen, Virgie Dad, MD  11/12/18 1:45 PM Medical Oncology and Hematology [facility] [address] [place], [place] [address] Tel. [phone]    Fax. [phone]   I, Wilburn Mylar, am acting as scribe for Dr. Virgie Dad. Madine Sarr.  I, Lurline Del MD, have reviewed the above documentation for accuracy and completeness, and I agree with the above.

## 2018-11-12 ENCOUNTER — Inpatient Hospital Stay: Payer: 59 | Attending: Oncology

## 2018-11-12 ENCOUNTER — Inpatient Hospital Stay (HOSPITAL_BASED_OUTPATIENT_CLINIC_OR_DEPARTMENT_OTHER): Payer: 59 | Admitting: Oncology

## 2018-11-12 ENCOUNTER — Other Ambulatory Visit: Payer: Self-pay

## 2018-11-12 VITALS — BP 116/77 | HR 79 | Temp 98.3°F | Resp 18 | Wt 187.8 lb

## 2018-11-12 DIAGNOSIS — D0501 Lobular carcinoma in situ of right breast: Secondary | ICD-10-CM

## 2018-11-12 DIAGNOSIS — D0512 Intraductal carcinoma in situ of left breast: Secondary | ICD-10-CM | POA: Insufficient documentation

## 2018-11-12 LAB — CBC WITH DIFFERENTIAL (CANCER CENTER ONLY)
Abs Immature Granulocytes: 0.02 10*3/uL (ref 0.00–0.07)
Basophils Absolute: 0 10*3/uL (ref 0.0–0.1)
Basophils Relative: 1 %
Eosinophils Absolute: 0.1 10*3/uL (ref 0.0–0.5)
Eosinophils Relative: 1 %
HCT: 37.1 % (ref 36.0–46.0)
Hemoglobin: 12.3 g/dL (ref 12.0–15.0)
Immature Granulocytes: 0 %
Lymphocytes Relative: 31 %
Lymphs Abs: 1.8 10*3/uL (ref 0.7–4.0)
MCH: 29.5 pg (ref 26.0–34.0)
MCHC: 33.2 g/dL (ref 30.0–36.0)
MCV: 89 fL (ref 80.0–100.0)
Monocytes Absolute: 0.4 10*3/uL (ref 0.1–1.0)
Monocytes Relative: 7 %
Neutro Abs: 3.5 10*3/uL (ref 1.7–7.7)
Neutrophils Relative %: 60 %
Platelet Count: 248 10*3/uL (ref 150–400)
RBC: 4.17 MIL/uL (ref 3.87–5.11)
RDW: 12.7 % (ref 11.5–15.5)
WBC Count: 5.8 10*3/uL (ref 4.0–10.5)
nRBC: 0 % (ref 0.0–0.2)

## 2018-11-12 LAB — CMP (CANCER CENTER ONLY)
ALT: 15 U/L (ref 0–44)
AST: 16 U/L (ref 15–41)
Albumin: 3.8 g/dL (ref 3.5–5.0)
Alkaline Phosphatase: 61 U/L (ref 38–126)
Anion gap: 8 (ref 5–15)
BUN: 15 mg/dL (ref 8–23)
CO2: 27 mmol/L (ref 22–32)
Calcium: 9.4 mg/dL (ref 8.9–10.3)
Chloride: 104 mmol/L (ref 98–111)
Creatinine: 0.86 mg/dL (ref 0.44–1.00)
GFR, Est AFR Am: >60 mL/min (ref >60)
GFR, Estimated: >60 mL/min (ref >60)
Glucose, Bld: 90 mg/dL (ref 70–99)
Potassium: 4 mmol/L (ref 3.5–5.1)
Sodium: 139 mmol/L (ref 135–145)
Total Bilirubin: 0.5 mg/dL (ref 0.3–1.2)
Total Protein: 6.7 g/dL (ref 6.5–8.1)

## 2019-01-27 ENCOUNTER — Other Ambulatory Visit: Payer: Self-pay | Admitting: Oncology

## 2019-01-28 ENCOUNTER — Other Ambulatory Visit: Payer: Self-pay | Admitting: *Deleted

## 2019-01-28 DIAGNOSIS — D0512 Intraductal carcinoma in situ of left breast: Secondary | ICD-10-CM

## 2019-01-28 MED ORDER — TAMOXIFEN CITRATE 20 MG PO TABS
20.0000 mg | ORAL_TABLET | Freq: Every day | ORAL | 3 refills | Status: DC
Start: 2019-01-28 — End: 2019-11-10

## 2019-03-20 ENCOUNTER — Ambulatory Visit: Payer: Medicare Other | Attending: Internal Medicine

## 2019-03-20 DIAGNOSIS — Z23 Encounter for immunization: Secondary | ICD-10-CM

## 2019-03-20 NOTE — Progress Notes (Signed)
   Covid-19 Vaccination Clinic  Name:  Jennifer Rodgers    MRN: [id number] DOB: Jun 14, 1955  03/20/2019  [name] was observed post Covid-19 immunization for 15 minutes without incidence. She was provided with Vaccine Information Sheet and instruction to access the V-Safe system.   [name] was instructed to call 911 with any severe reactions post vaccine: Marland Kitchen Difficulty breathing  . Swelling of your face and throat  . A fast heartbeat  . A bad rash all over your body  . Dizziness and weakness    Immunizations Administered    Name Date Dose VIS Date Route   Pfizer COVID-19 Vaccine 03/20/2019  8:28 AM 0.3 mL 12/31/2018 Intramuscular   Manufacturer: [place]   Lot: [id number]   [place]: [id number]

## 2019-04-13 ENCOUNTER — Ambulatory Visit: Payer: Medicare Other | Attending: Internal Medicine

## 2019-04-13 DIAGNOSIS — Z23 Encounter for immunization: Secondary | ICD-10-CM

## 2019-04-13 NOTE — Progress Notes (Signed)
   Covid-19 Vaccination Clinic  Name:  Jennifer Rodgers    MRN: [id number] DOB: 1955/08/01  04/13/2019  Jennifer Rodgers was observed post Covid-19 immunization for 15 minutes without incident. She was provided with Vaccine Information Sheet and instruction to access the V-Safe system.   Jennifer Rodgers was instructed to call 911 with any severe reactions post vaccine: Marland Kitchen Difficulty breathing  . Swelling of face and throat  . A fast heartbeat  . A bad rash all over body  . Dizziness and weakness   Immunizations Administered    Name Date Dose VIS Date Route   Pfizer COVID-19 Vaccine 04/13/2019  8:31 AM 0.3 mL 12/31/2018 Intramuscular   Manufacturer: [place]   Lot: [id number]   [place]: [id number]

## 2019-09-19 ENCOUNTER — Other Ambulatory Visit: Payer: Self-pay | Admitting: Oncology

## 2019-09-19 DIAGNOSIS — Z9889 Other specified postprocedural states: Secondary | ICD-10-CM

## 2019-11-09 ENCOUNTER — Other Ambulatory Visit: Payer: Self-pay | Admitting: Oncology

## 2019-11-09 DIAGNOSIS — D0512 Intraductal carcinoma in situ of left breast: Secondary | ICD-10-CM

## 2019-11-11 ENCOUNTER — Other Ambulatory Visit: Payer: Self-pay

## 2019-11-11 ENCOUNTER — Ambulatory Visit
Admission: RE | Admit: 2019-11-11 | Discharge: 2019-11-11 | Disposition: A | Discharge status: Home / Self Care | Payer: Medicare Other | Source: Ambulatory Visit | Attending: Oncology | Admitting: Oncology

## 2019-11-11 DIAGNOSIS — Z9889 Other specified postprocedural states: Secondary | ICD-10-CM

## 2019-11-14 ENCOUNTER — Other Ambulatory Visit: Payer: Self-pay

## 2019-11-14 DIAGNOSIS — D0501 Lobular carcinoma in situ of right breast: Secondary | ICD-10-CM

## 2019-11-14 DIAGNOSIS — D0512 Intraductal carcinoma in situ of left breast: Secondary | ICD-10-CM

## 2019-11-15 ENCOUNTER — Inpatient Hospital Stay: Payer: Medicare Other | Admitting: Oncology

## 2019-11-15 ENCOUNTER — Inpatient Hospital Stay: Payer: Medicare Other | Attending: Oncology

## 2019-11-15 ENCOUNTER — Other Ambulatory Visit: Payer: Self-pay

## 2019-11-15 VITALS — BP 130/74 | HR 104 | Temp 98.5°F | Resp 18 | Wt 188.4 lb

## 2019-11-15 DIAGNOSIS — D0512 Intraductal carcinoma in situ of left breast: Secondary | ICD-10-CM | POA: Diagnosis present

## 2019-11-15 DIAGNOSIS — Z923 Personal history of irradiation: Secondary | ICD-10-CM | POA: Insufficient documentation

## 2019-11-15 DIAGNOSIS — D0501 Lobular carcinoma in situ of right breast: Secondary | ICD-10-CM | POA: Diagnosis present

## 2019-11-15 LAB — CMP (CANCER CENTER ONLY)
ALT: 14 U/L (ref 0–44)
AST: 14 U/L — ABNORMAL LOW (ref 15–41)
Albumin: 3.7 g/dL (ref 3.5–5.0)
Alkaline Phosphatase: 60 U/L (ref 38–126)
Anion gap: 5 (ref 5–15)
BUN: 12 mg/dL (ref 8–23)
CO2: 29 mmol/L (ref 22–32)
Calcium: 9.4 mg/dL (ref 8.9–10.3)
Chloride: 107 mmol/L (ref 98–111)
Creatinine: 0.82 mg/dL (ref 0.44–1.00)
GFR, Estimated: >60 mL/min (ref >60)
Glucose, Bld: 112 mg/dL — ABNORMAL HIGH (ref 70–99)
Potassium: 4 mmol/L (ref 3.5–5.1)
Sodium: 141 mmol/L (ref 135–145)
Total Bilirubin: 0.4 mg/dL (ref 0.3–1.2)
Total Protein: 6.6 g/dL (ref 6.5–8.1)

## 2019-11-15 LAB — CBC WITH DIFFERENTIAL (CANCER CENTER ONLY)
Abs Immature Granulocytes: 0.02 10*3/uL (ref 0.00–0.07)
Basophils Absolute: 0 10*3/uL (ref 0.0–0.1)
Basophils Relative: 0 %
Eosinophils Absolute: 0.1 10*3/uL (ref 0.0–0.5)
Eosinophils Relative: 1 %
HCT: 35.8 % — ABNORMAL LOW (ref 36.0–46.0)
Hemoglobin: 11.7 g/dL — ABNORMAL LOW (ref 12.0–15.0)
Immature Granulocytes: 0 %
Lymphocytes Relative: 34 %
Lymphs Abs: 2 10*3/uL (ref 0.7–4.0)
MCH: 29 pg (ref 26.0–34.0)
MCHC: 32.7 g/dL (ref 30.0–36.0)
MCV: 88.6 fL (ref 80.0–100.0)
Monocytes Absolute: 0.4 10*3/uL (ref 0.1–1.0)
Monocytes Relative: 7 %
Neutro Abs: 3.4 10*3/uL (ref 1.7–7.7)
Neutrophils Relative %: 58 %
Platelet Count: 245 10*3/uL (ref 150–400)
RBC: 4.04 MIL/uL (ref 3.87–5.11)
RDW: 12.5 % (ref 11.5–15.5)
WBC Count: 5.9 10*3/uL (ref 4.0–10.5)
nRBC: 0 % (ref 0.0–0.2)

## 2019-11-15 MED ORDER — TAMOXIFEN CITRATE 20 MG PO TABS: 20.0000 mg | ORAL_TABLET | Freq: Every day | ORAL | 4 refills | Status: DC

## 2019-11-15 NOTE — Progress Notes (Signed)
Jennifer Rodgers  [phone] [phone]     ID: Jennifer Rodgers DOB: 08/20/1955  MR#: [id number]  GGE#:[id number]  Patient Care Team: [name], MD as PCP - General (Internal Medicine) Elex Mainwaring, Virgie Dad, MD as Consulting Physician (Oncology) Erroll Luna, MD as Consulting Physician (General Surgery) Richmond Campbell, MD as Consulting Physician (Gastroenterology) Allyn Kenner, MD (Dermatology) Delice Bison, Charlestine Massed, NP as Nurse Practitioner (Hematology and Oncology) Gery Pray, MD as Consulting Physician (Radiation Oncology) OTHER MD:  CHIEF COMPLAINT: Ductal carcinoma in situ, LCIS  CURRENT TREATMENT: tamoxifen   INTERVAL HISTORY: [name] returns today for follow-up of her estrogen receptor positive ductal carcinoma in situ.   She continues on tamoxifen, with good tolerance.  Hot flashes and vaginal wetness are not major issues.  She obtains it under good price.  Since her last visit, she underwent bilateral diagnostic mammography with tomography at [facility] on 11/11/2019 showing: breast density category C; no evidence of malignancy in either breast.    REVIEW OF SYSTEMS: Jennifer Rodgers retired December 2020 and is having a very good time going to the beach, [place]..  She was doing 7500 steps a day to get an insurance rebate but she is slackened up a little bit recently.  She is on both doses of her COVID-19 vaccine [employer]), as well as her flu shot and her shingles shot.  Aside from these issues a detailed review of systems today was stable.   BREAST CANCER HISTORY: From the original intake note:  [name] had routine screening mammography in September 2017 suggesting possible areas of distortion in both breasts. On 10/22/2015 she underwent bilateral diagnostic mammography with tomography at the [facility]. The Breast density was category C.  In the right breast there was an area of persistent  distortion at the 12:00 region. There was also an area of distortion in the subareolar region of the left breast.  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  The patient's subsequent history is as detailed below   PAST MEDICAL HISTORY: Past Medical History:  Diagnosis Date  . Abdominal pain, unspecified site   . Acute pharyngitis   . Acute upper respiratory infections of unspecified site   . Allergic rhinitis, cause unspecified   . Breast cancer ([facility]) 10/24/2015   Left breast   . Degeneration of cervical intervertebral disc   . Diaphragmatic hernia without mention of obstruction or gangrene   . Dysuria   . Esophageal reflux   . History of radiation therapy 03/18/16-04/15/16   left breast 42.72 Gy in 6 fractions, boost 10 Gy in 5 fractions  . Migraine without aura, without mention of intractable migraine without mention of status migrainosus   . Nausea alone   . Other constipation   . Other malaise and fatigue   . Personal history of radiation therapy 2018   Left Breast Cancer  . PONV (postoperative nausea and vomiting)   . Screening for depression   . Unspecified hypothyroidism   . Unspecified venous (peripheral) insufficiency   . Urinary frequency     PAST SURGICAL HISTORY: Past Surgical History:  Procedure Laterality Date  . BREAST BIOPSY Bilateral 10/24/2015   LCIS  . BREAST BIOPSY Right 11/23/2015   lcis  . BREAST EXCISIONAL BIOPSY Right 01/2016  . BREAST LUMPECTOMY Left 01/2016  DCIS  . BREAST LUMPECTOMY WITH NEEDLE LOCALIZATION Bilateral 01/31/2016   Procedure: RIGHT BREAST LUMPECTOMY WITH DOUBLE NEEDLE LOCALIZATION, LEFT BREAST NEEDLE LOCALIZED LUMPECTOMY;  Surgeon: Erroll Luna, MD;  Location: [facility];  Service: General;   Laterality: Bilateral;  . COLONOSCOPY  03/03/2011  . LUMBAR LAMINECTOMY  2005  . SALPINGOOPHORECTOMY  2004  . SHOULDER ARTHROSCOPY Left 2008  . THYROID SURGERY  2006   resection of L nodule    FAMILY HISTORY Family History  Problem Relation Age of Onset  . Hypertension Mother   . Thyroid disease Mother   . Hyperlipidemia Mother   . Breast cancer Mother 4  . Hypertension Sister   . Hyperlipidemia Sister   . Diabetes Sister   . Hypertension Daughter   . Hyperlipidemia Daughter   The patient has no information regarding her father or his Rodgers of the family. The patient's mother is alive at age 60. She developed breast cancer in her early 50s. She has also been found to have multiple polyps. She has not had genetics testing. The patient has no brothers. She has 3 half-sisters. There is no history of breast or ovarian cancer in the family otherwise than as noted   GYNECOLOGIC HISTORY:  No LMP recorded. Patient has had a hysterectomy. Menarche age 64, first live birth age 64, the patient is GX P1. She underwent TAH-BSO in 2004. She has been on hormone replacement with estrogen until 2017.   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  Jennifer Rodgers is retired. He used to work as a Paediatric nurse. Daughter Jennifer Rodgers lives in [place] where she works in Insurance underwriter out of her home. The patient has 2 grandchildren and 2 great-grandchildren. She is not a Ambulance person.    ADVANCED DIRECTIVES: Not in place   HEALTH MAINTENANCE: Social History   Tobacco Use  . Smoking status: Never Smoker  . Smokeless tobacco: Never Used  Substance Use Topics  . Alcohol use: No    Alcohol/week: 0.5 standard drinks    Types: 1 Standard drinks or equivalent per week  . Drug use: No     Colonoscopy: April 2018/[facility]  PAP: Status post hysterectomy  Bone density: Never   Allergies  Allergen Reactions  . Prednisone Other (See Comments)     Extreme facial flushing and confusion    Current Outpatient Medications  Medication Sig Dispense Refill  . Omega-3 Fatty Acids (FISH OIL CONCENTRATE PO) Take 1 capsule by mouth daily.     . pantoprazole (PROTONIX) 40 MG tablet Take 40 mg by mouth daily.    Marland Kitchen SYNTHROID 100 MCG tablet Take 1 tablet by mouth daily.    . tamoxifen (NOLVADEX) 20 MG tablet TAKE 1 TABLET BY MOUTH  DAILY 90 tablet 4  . vitamin B-12 (CYANOCOBALAMIN) 1000 MCG tablet Take 1,000 mcg by mouth 2 (two) times daily.    . vitamin C (ASCORBIC ACID) 250 MG tablet Take 250 mg by mouth 2 (two) times daily.    Marland Kitchen VITAMIN D, CHOLECALCIFEROL, PO Take 1,000 Units by mouth daily.      No current facility-administered medications for this visit.    OBJECTIVE: White woman in no acute distress  Vitals:   11/15/19 1337  BP: 130/74  Pulse: (!) 104  Resp: 18  Temp: 98.5 F (36.9 C)  SpO2: 97%     Body mass index is 31.35 kg/m.    ECOG FS:0 - Asymptomatic  Sclerae unicteric,  EOMs intact Wearing a mask No cervical or supraclavicular adenopathy Lungs no rales or rhonchi Heart regular rate and rhythm Abd soft, nontender, positive bowel sounds MSK no focal spinal tenderness, no upper extremity lymphedema Neuro: nonfocal, well oriented, appropriate affect Breasts: Status post bilateral lumpectomies, with no evidence of disease recurrence.  Axillae are benign.   LAB RESULTS:  CMP     Component Value Date/Time   NA 139 11/12/2018 1247   NA 141 09/16/2016 1310   K 4.0 11/12/2018 1247   K 3.8 09/16/2016 1310   CL 104 11/12/2018 1247   CO2 27 11/12/2018 1247   CO2 28 09/16/2016 1310   GLUCOSE 90 11/12/2018 1247   GLUCOSE 112 09/16/2016 1310   BUN 15 11/12/2018 1247   BUN 13.8 09/16/2016 1310   CREATININE 0.86 11/12/2018 1247   CREATININE 0.8 09/16/2016 1310   CALCIUM 9.4 11/12/2018 1247   CALCIUM 9.7 09/16/2016 1310   PROT 6.7 11/12/2018 1247   PROT 6.8 09/16/2016 1310   ALBUMIN 3.8 11/12/2018 1247   ALBUMIN 3.5  09/16/2016 1310   AST 16 11/12/2018 1247   AST 20 09/16/2016 1310   ALT 15 11/12/2018 1247   ALT 22 09/16/2016 1310   ALKPHOS 61 11/12/2018 1247   ALKPHOS 61 09/16/2016 1310   BILITOT 0.5 11/12/2018 1247   BILITOT 0.31 09/16/2016 1310   GFRNONAA >60 11/12/2018 1247   GFRAA >60 11/12/2018 1247    INo results found for: SPEP, UPEP  Lab Results  Component Value Date   WBC 5.9 11/15/2019   NEUTROABS 3.4 11/15/2019   HGB 11.7 (L) 11/15/2019   HCT 35.8 (L) 11/15/2019   MCV 88.6 11/15/2019   PLT 245 11/15/2019      Chemistry      Component Value Date/Time   NA 139 11/12/2018 1247   NA 141 09/16/2016 1310   K 4.0 11/12/2018 1247   K 3.8 09/16/2016 1310   CL 104 11/12/2018 1247   CO2 27 11/12/2018 1247   CO2 28 09/16/2016 1310   BUN 15 11/12/2018 1247   BUN 13.8 09/16/2016 1310   CREATININE 0.86 11/12/2018 1247   CREATININE 0.8 09/16/2016 1310      Component Value Date/Time   CALCIUM 9.4 11/12/2018 1247   CALCIUM 9.7 09/16/2016 1310   ALKPHOS 61 11/12/2018 1247   ALKPHOS 61 09/16/2016 1310   AST 16 11/12/2018 1247   AST 20 09/16/2016 1310   ALT 15 11/12/2018 1247   ALT 22 09/16/2016 1310   BILITOT 0.5 11/12/2018 1247   BILITOT 0.31 09/16/2016 1310       No results found for: LABCA2  No components found for: LABCA125  No results for input(s): INR in the last 168 hours.  Urinalysis No results found for: COLORURINE, APPEARANCEUR, LABSPEC, PHURINE, GLUCOSEU, HGBUR, BILIRUBINUR, KETONESUR, PROTEINUR, UROBILINOGEN, NITRITE, LEUKOCYTESUR   STUDIES: MM DIAG BREAST TOMO BILATERAL  Result Date: 11/11/2019 CLINICAL DATA:  LEFT lumpectomy with radiation therapy in 2018. Benign excisional biopsy in the RIGHT breast 2018. EXAM: DIGITAL DIAGNOSTIC BILATERAL MAMMOGRAM WITH TOMO AND CAD COMPARISON:  Previous exam(s). ACR Breast Density Category c: The breast tissue is heterogeneously dense, which may obscure small masses. FINDINGS: Postoperative changes are identified  bilaterally. No suspicious mass, distortion, or microcalcifications are identified to suggest presence of malignancy. Mammographic images were processed with CAD. IMPRESSION: No mammographic evidence for malignancy. RECOMMENDATION: Diagnostic mammogram is suggested in 1 year. (Code:[id number]-B-01Y) I have discussed the findings and recommendations with the patient. If applicable, a reminder  letter will be sent to the patient regarding the next appointment. BI-RADS CATEGORY  2: Benign. Electronically Signed   By: Nolon Nations M.D.   On: 11/11/2019 10:54    ELIGIBLE FOR AVAILABLE RESEARCH PROTOCOL: COMET  ASSESSMENT: 64 y.o. [place] woman status post left breast biopsy 10/24/2015 showing ductal carcinoma in situ, low-grade, estrogen and progesterone receptor positive, involving a complex sclerosing lesion, and LCIS  (a) right sided LCIS also biopsied 11/23/2015  (1) the patient considered the COMET trial but is ineligible because of contralateral LCIS  (2) breast MRI 11/15/2015 showed 2 additional masses in the right breast not seen by prior studies  (a) biopsy of an upper inner quadrant right breast lesion showed lobular carcinoma in situ.  (b) the additional right breast lesion noted in 11/15/2015 was not apparent on the 11/23/2015 MRI  (3) Status post bilateral lumpectomies 01/31/2016, showing  (a) on the right, lobular carcinoma in situ  (b) on the left, ductal carcinoma in situ, low-grade, with negative margins  (4) adjuvant radiation 03/18/16 - 04/15/16 Site/dose:    1) Left Breast: 42.72 Gy in 16 fractions 2) Left Breast Boost: 10 Gy in 5 fractions   (5) tamoxifen started November 2017, to be continued for total of 5 years   PLAN: Liviya is now just over 4 years out from definitive surgery for breast cancer with no evidence of disease recurrence.  This is very favorable.  She is tolerating tamoxifen well and the plan is to continue that an additional year.  At her next visit she  will be ready to "graduate".  She knows to call for any other issue that may develop before then  Total encounter time 20 minutes.*  Shawntell Dixson, Virgie Dad, MD  11/15/19 1:45 PM Medical Oncology and Hematology [facility] [place], [place] [address] Tel. [phone]    Fax. [phone]   I, Wilburn Mylar, am acting as scribe for Dr. Virgie Dad. Jabe Jeanbaptiste.  I, Lurline Del MD, have reviewed the above documentation for accuracy and completeness, and I agree with the above.   *Total Encounter Time as defined by the Centers for Medicare and Medicaid Services includes, in addition to the face-to-face time of a patient visit (documented in the note above) non-face-to-face time: obtaining and reviewing outside history, ordering and reviewing medications, tests or procedures, care coordination (communications with other health care professionals or caregivers) and documentation in the medical record.

## 2019-12-20 ENCOUNTER — Telehealth: Payer: Self-pay | Admitting: Unknown Physician Specialty

## 2019-12-20 ENCOUNTER — Telehealth (HOSPITAL_COMMUNITY): Payer: Self-pay

## 2019-12-20 NOTE — Telephone Encounter (Signed)
Called to Discuss with patient about Covid symptoms and the use of the monoclonal antibody infusion for those with mild to moderate Covid symptoms and at a high risk of hospitalization.     Pt appears to qualify for this infusion due to co-morbid conditions and/or a member of an at-risk group in accordance with the FDA Emergency Use Authorization.    Unable to reach pt   [facility]

## 2019-12-20 NOTE — Telephone Encounter (Signed)
Called to Discuss with patient about Covid symptoms and the use of the monoclonal antibody infusion for those with mild to moderate Covid symptoms and at a high risk of hospitalization.     Pt appears to qualify for this infusion due to co-morbid conditions and/or a member of an at-risk group in accordance with the FDA Emergency Use Authorization.    Pt stated her symptoms started on 11/24, she tested "abnormal" on 11/23 when her husband also had COVID but she was asymptomatic. She developed symptoms a few days later and tested positive on 11/26 at [facility]. She complains of coughing, sneezing, congestion and loss of smell. RN informed pt that an APP will be calling to verify information and if she qualifies will set her up with an appointment. Pt qualifies for treatment based on age and history of cancer.

## 2019-12-20 NOTE — Telephone Encounter (Signed)
Called to discuss with [place] about Covid symptoms and the use of  monoclonal antibody infusion for those with mild to moderate Covid symptoms and at a high risk of hospitalization.     Pt is qualified for this infusion due to co-morbid conditions and/or a member of an at-risk group, however declines infusion at this time as she is fully vaccinated and improving.   Symptoms reviewed as well as criteria for ending isolation.  Symptoms reviewed that would warrant ED/Hospital evaluation. Preventative practices reviewed. Patient verbalized understanding. Patient advised to call back if he decides that he does want to get infusion. Callback number to the infusion center given. Patient advised to go to Urgent care or ED with severe symptoms. Last date she would be eligible would be Friday    Patient Active Problem List   Diagnosis Date Noted  . Lobular carcinoma in situ (LCIS) of right breast 11/08/2015  . Ductal carcinoma in situ (DCIS) of left breast 11/07/2015

## 2020-01-06 ENCOUNTER — Ambulatory Visit: Payer: Medicare Other | Attending: Internal Medicine

## 2020-01-06 DIAGNOSIS — Z23 Encounter for immunization: Secondary | ICD-10-CM

## 2020-01-06 NOTE — Progress Notes (Signed)
   Covid-19 Vaccination Clinic  Name:  Jennifer Rodgers    MRN: [id number] DOB: August 17, 1955  01/06/2020  [name] was observed post Covid-19 immunization for 15 minutes without incident. She was provided with Vaccine Information Sheet and instruction to access the V-Safe system.   [name] was instructed to call 911 with any severe reactions post vaccine: Marland Kitchen Difficulty breathing  . Swelling of face and throat  . A fast heartbeat  . A bad rash all over body  . Dizziness and weakness   Immunizations Administered    Name Date Dose VIS Date Route   Pfizer COVID-19 Vaccine 01/06/2020  1:41 PM 0.3 mL 11/09/2019 Intramuscular   Manufacturer: [place]   Lot: [id number]   [place]: [id number]

## 2020-10-12 ENCOUNTER — Other Ambulatory Visit: Payer: Self-pay | Admitting: Oncology

## 2020-10-12 DIAGNOSIS — Z853 Personal history of malignant neoplasm of breast: Secondary | ICD-10-CM

## 2020-11-13 ENCOUNTER — Ambulatory Visit
Admission: RE | Admit: 2020-11-13 | Discharge: 2020-11-13 | Disposition: A | Discharge status: Home / Self Care | Payer: Medicare Other | Source: Ambulatory Visit | Attending: Oncology | Admitting: Oncology

## 2020-11-13 ENCOUNTER — Other Ambulatory Visit: Payer: Self-pay | Admitting: *Deleted

## 2020-11-13 ENCOUNTER — Other Ambulatory Visit: Payer: Self-pay

## 2020-11-13 DIAGNOSIS — D0501 Lobular carcinoma in situ of right breast: Secondary | ICD-10-CM

## 2020-11-13 DIAGNOSIS — Z853 Personal history of malignant neoplasm of breast: Secondary | ICD-10-CM

## 2020-11-13 NOTE — Progress Notes (Signed)
[facility]  [phone] [phone]     ID: Shalva Rozycki DOB: July 12, 1955  MR#: [id number]  VXY#:[id number]  Patient Care Team: [name], MD as PCP - General (Internal Medicine) Allard Lightsey, Virgie Dad, MD as Consulting Physician (Oncology) Erroll Luna, MD as Consulting Physician (General Surgery) Richmond Campbell, MD as Consulting Physician (Gastroenterology) Allyn Kenner, MD (Dermatology) Delice Bison, Charlestine Massed, NP as Nurse Practitioner (Hematology and Oncology) Gery Pray, MD as Consulting Physician (Radiation Oncology) OTHER MD:  CHIEF COMPLAINT: Ductal carcinoma in situ, LCIS  CURRENT TREATMENT: completing 5 years of tamoxifen   INTERVAL HISTORY: Ryland returns today for follow-up of her estrogen receptor positive ductal carcinoma in situ.   She continues on tamoxifen, with good tolerance.  She has tolerated remarkably well and only has a few more pills to go before she definitively runs out.  She underwent bilateral diagnostic mammography with tomography at [facility] yesterday, 11/13/2020, showing: breast density category C; no evidence of malignancy in either breast.    REVIEW OF SYSTEMS: Jacayla joining the gym in 2021 but is having trouble exercising because of left hip issues.  This is painful when she lies on the left side and also when she walks more than a little bit.  She tells me she is going to be seeing orthopedics for further evaluation soon.  Aside from that a detailed review of systems today was stable.   COVID 19 VACCINATION STATUS: Pfizer x3   BREAST CANCER HISTORY: From the original intake note:  Genessis had routine screening mammography in September 2017 suggesting possible areas of distortion in both breasts. On 10/22/2015 she underwent bilateral diagnostic mammography with tomography at the [facility]. The Breast density was category C.  In the right breast there was an area of persistent  distortion at the 12:00 region. There was also an area of distortion in the subareolar region of the left breast.  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  The patient's subsequent history is as detailed below   PAST MEDICAL HISTORY: Past Medical History:  Diagnosis Date   Abdominal pain, unspecified site    Acute pharyngitis    Acute upper respiratory infections of unspecified site    Allergic rhinitis, cause unspecified    Breast cancer ([facility]) 10/24/2015   Left breast    Degeneration of cervical intervertebral disc    Diaphragmatic hernia without mention of obstruction or gangrene    Dysuria    Esophageal reflux    History of radiation therapy 03/18/16-04/15/16   left breast 42.72 Gy in 6 fractions, boost 10 Gy in 5 fractions   Migraine without aura, without mention of intractable migraine without mention of status migrainosus    Nausea alone    Other constipation    Other malaise and fatigue    Personal history of radiation therapy 2018   Left Breast Cancer   PONV (postoperative nausea and vomiting)    Screening for depression    Unspecified hypothyroidism    Unspecified venous (peripheral) insufficiency    Urinary frequency     PAST SURGICAL HISTORY: Past Surgical History:  Procedure Laterality Date   BREAST BIOPSY Bilateral 10/24/2015   LCIS   BREAST BIOPSY Right 11/23/2015   lcis   BREAST EXCISIONAL BIOPSY Right 01/2016   BREAST LUMPECTOMY Left 01/2016  DCIS   BREAST LUMPECTOMY WITH NEEDLE LOCALIZATION Bilateral 01/31/2016   Procedure: RIGHT BREAST LUMPECTOMY WITH DOUBLE NEEDLE LOCALIZATION, LEFT BREAST NEEDLE LOCALIZED LUMPECTOMY;  Surgeon: Erroll Luna, MD;  Location: [facility];  Service: General;  Laterality: Bilateral;    COLONOSCOPY  03/03/2011   LUMBAR LAMINECTOMY  2005   SALPINGOOPHORECTOMY  2004   SHOULDER ARTHROSCOPY Left 2008   THYROID SURGERY  2006   resection of L nodule    FAMILY HISTORY Family History  Problem Relation Age of Onset   Hypertension Mother    Thyroid disease Mother    Hyperlipidemia Mother    Breast cancer Mother 52   Hypertension Sister    Hyperlipidemia Sister    Diabetes Sister    Hypertension Daughter    Hyperlipidemia Daughter   The patient has no information regarding her father or his side of the family. The patient's mother is alive at age 9. She developed breast cancer in her early 40s. She has also been found to have multiple polyps. She has not had genetics testing. The patient has no brothers. She has 3 half-sisters. There is no history of breast or ovarian cancer in the family otherwise than as noted   GYNECOLOGIC HISTORY:  No LMP recorded. Patient has had a hysterectomy. Menarche age 60, first live birth age 59, the patient is GX P1. She underwent TAH-BSO in 2004. She has been on hormone replacement with estrogen until 2017.   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  [name] is retired. He used to work as a Paediatric nurse. Daughter [name] lives in [place] where she works in Insurance underwriter out of her home. The patient has 2 grandchildren and 2 great-grandchildren. She is not a Ambulance person.    ADVANCED DIRECTIVES: Not in place   HEALTH MAINTENANCE: Social History   Tobacco Use   Smoking status: Never   Smokeless tobacco: Never  Substance Use Topics   Alcohol use: No    Alcohol/week: 0.5 standard drinks    Types: 1 Standard drinks or equivalent per week   Drug use: No     Colonoscopy: April 2018/[facility]  PAP: Status post hysterectomy  Bone density: Never   Allergies  Allergen Reactions   Prednisone Other (See Comments)    Extreme facial flushing and confusion    Current  Outpatient Medications  Medication Sig Dispense Refill   pantoprazole (PROTONIX) 40 MG tablet Take 40 mg by mouth daily.     SYNTHROID 100 MCG tablet Take 1 tablet by mouth daily.     tamoxifen (NOLVADEX) 20 MG tablet Take 1 tablet (20 mg total) by mouth daily. 90 tablet 4   vitamin B-12 (CYANOCOBALAMIN) 1000 MCG tablet Take 1,000 mcg by mouth 2 (two) times daily.     vitamin C (ASCORBIC ACID) 250 MG tablet Take 250 mg by mouth 2 (two) times daily.     VITAMIN D, CHOLECALCIFEROL, PO Take 1,000 Units by mouth daily.      No current facility-administered medications for this visit.    OBJECTIVE: White woman in no acute distress  Vitals:   11/14/20 1310  BP: 106/63  Pulse: (!) 117  Resp: 18  Temp: 97.6 F (36.4 C)  SpO2: 97%      Body mass index is 31.7 kg/m.    ECOG FS:0 - Asymptomatic  Sclerae unicteric, EOMs intact Wearing a mask No cervical or supraclavicular adenopathy Lungs no rales or rhonchi Heart regular  rate and rhythm Abd soft, nontender, positive bowel sounds MSK no focal spinal tenderness, no upper extremity lymphedema Neuro: nonfocal, well oriented, appropriate affect Breasts: Status post bilateral lumpectomies.  There is no evidence of local recurrence.  Both axillae are benign.   LAB RESULTS:  CMP     Component Value Date/Time   NA 141 11/15/2019 1308   NA 141 09/16/2016 1310   K 4.0 11/15/2019 1308   K 3.8 09/16/2016 1310   CL 107 11/15/2019 1308   CO2 29 11/15/2019 1308   CO2 28 09/16/2016 1310   GLUCOSE 112 (H) 11/15/2019 1308   GLUCOSE 112 09/16/2016 1310   BUN 12 11/15/2019 1308   BUN 13.8 09/16/2016 1310   CREATININE 0.82 11/15/2019 1308   CREATININE 0.8 09/16/2016 1310   CALCIUM 9.4 11/15/2019 1308   CALCIUM 9.7 09/16/2016 1310   PROT 6.6 11/15/2019 1308   PROT 6.8 09/16/2016 1310   ALBUMIN 3.7 11/15/2019 1308   ALBUMIN 3.5 09/16/2016 1310   AST 14 (L) 11/15/2019 1308   AST 20 09/16/2016 1310   ALT 14 11/15/2019 1308   ALT 22  09/16/2016 1310   ALKPHOS 60 11/15/2019 1308   ALKPHOS 61 09/16/2016 1310   BILITOT 0.4 11/15/2019 1308   BILITOT 0.31 09/16/2016 1310   GFRNONAA >60 11/15/2019 1308   GFRAA >60 11/12/2018 1247    INo results found for: SPEP, UPEP  Lab Results  Component Value Date   WBC 7.6 11/14/2020   NEUTROABS 5.0 11/14/2020   HGB 11.8 (L) 11/14/2020   HCT 35.1 (L) 11/14/2020   MCV 86.7 11/14/2020   PLT 259 11/14/2020      Chemistry      Component Value Date/Time   NA 141 11/15/2019 1308   NA 141 09/16/2016 1310   K 4.0 11/15/2019 1308   K 3.8 09/16/2016 1310   CL 107 11/15/2019 1308   CO2 29 11/15/2019 1308   CO2 28 09/16/2016 1310   BUN 12 11/15/2019 1308   BUN 13.8 09/16/2016 1310   CREATININE 0.82 11/15/2019 1308   CREATININE 0.8 09/16/2016 1310      Component Value Date/Time   CALCIUM 9.4 11/15/2019 1308   CALCIUM 9.7 09/16/2016 1310   ALKPHOS 60 11/15/2019 1308   ALKPHOS 61 09/16/2016 1310   AST 14 (L) 11/15/2019 1308   AST 20 09/16/2016 1310   ALT 14 11/15/2019 1308   ALT 22 09/16/2016 1310   BILITOT 0.4 11/15/2019 1308   BILITOT 0.31 09/16/2016 1310       No results found for: LABCA2  No components found for: LABCA125  No results for input(s): INR in the last 168 hours.  Urinalysis No results found for: COLORURINE, APPEARANCEUR, LABSPEC, PHURINE, GLUCOSEU, HGBUR, BILIRUBINUR, KETONESUR, PROTEINUR, UROBILINOGEN, NITRITE, LEUKOCYTESUR   STUDIES: MM DIAG BREAST TOMO BILATERAL  Result Date: 11/13/2020 CLINICAL DATA:  History of left breast cancer. EXAM: DIGITAL DIAGNOSTIC BILATERAL MAMMOGRAM WITH TOMOSYNTHESIS AND CAD TECHNIQUE: Bilateral digital diagnostic mammography and breast tomosynthesis was performed. The images were evaluated with computer-aided detection. COMPARISON:  Previous exam(s). ACR Breast Density Category c: The breast tissue is heterogeneously dense, which may obscure small masses. FINDINGS: Bilateral lumpectomy changes are stable. No  suspicious masses, calcifications, or distortion are identified in either breast. IMPRESSION: No mammographic evidence of malignancy.  Stable lumpectomy changes. RECOMMENDATION: Annual screening mammography. I have discussed the findings and recommendations with the patient. If applicable, a reminder letter will be sent to the patient regarding the next appointment. BI-RADS CATEGORY  2: Benign. Electronically Signed   By: Dorise Bullion III M.D.   On: 11/13/2020 11:47    ELIGIBLE FOR AVAILABLE RESEARCH PROTOCOL: COMET  ASSESSMENT: 65 y.o. [place] woman status post left breast biopsy 10/24/2015 showing ductal carcinoma in situ, low-grade, estrogen and progesterone receptor positive, involving a complex sclerosing lesion, and LCIS  (a) right sided LCIS also biopsied 11/23/2015  (1) the patient considered the COMET trial but is ineligible because of contralateral LCIS  (2) breast MRI 11/15/2015 showed 2 additional masses in the right breast not seen by prior studies  (a) biopsy of an upper inner quadrant right breast lesion showed lobular carcinoma in situ.  (b) the additional right breast lesion noted in 11/15/2015 was not apparent on the 11/23/2015 MRI  (3) Status post bilateral lumpectomies 01/31/2016, showing  (a) on the right, lobular carcinoma in situ  (b) on the left, ductal carcinoma in situ, low-grade, with negative margins  (4) adjuvant radiation 03/18/16 - 04/15/16  Site/dose:    1) Left Breast: 42.72 Gy in 16 fractions 2) Left Breast Boost: 10 Gy in 5 fractions   (5) tamoxifen started November 2017, completing 5 years October 2022   PLAN: Shalisa is coming up on 5 years from definitive surgery for her breast cancer with no evidence of disease recurrence.  This is very favorable.  She recalls that since her cancer was not invasive it was not life-threatening.  She now has completed 5 years of tamoxifen.  We do not have data for continuing antiestrogens more than 5 years  and noninvasive disease like hers and so we are discontinuing that medication.  At this time I feel comfortable releasing her to her primary care physicians.  All she will need in terms of breast cancer follow-up is a yearly mammogram and a yearly physician breast exam  I will be glad to see Kaoir again at any time in the future if and when the need arises but as of now are making no further routine appointments for her here.  Total encounter time 20 minutes.*  Minnette Merida, Virgie Dad, MD  11/14/20 1:22 PM Medical Oncology and Hematology [facility] [place], [place] [address] Tel. [phone]    Fax. [phone]   I, Wilburn Mylar, am acting as scribe for Dr. Virgie Dad. Lillyona Polasek.  I, Lurline Del MD, have reviewed the above documentation for accuracy and completeness, and I agree with the above.   *Total Encounter Time as defined by the Centers for Medicare and Medicaid Services includes, in addition to the face-to-face time of a patient visit (documented in the note above) non-face-to-face time: obtaining and reviewing outside history, ordering and reviewing medications, tests or procedures, care coordination (communications with other health care professionals or caregivers) and documentation in the medical record.

## 2020-11-14 ENCOUNTER — Inpatient Hospital Stay: Payer: Medicare Other

## 2020-11-14 ENCOUNTER — Inpatient Hospital Stay: Payer: Medicare Other | Attending: Oncology | Admitting: Oncology

## 2020-11-14 VITALS — BP 106/63 | HR 117 | Temp 97.6°F | Resp 18 | Ht 65.0 in | Wt 190.5 lb

## 2020-11-14 DIAGNOSIS — D0501 Lobular carcinoma in situ of right breast: Secondary | ICD-10-CM | POA: Diagnosis not present

## 2020-11-14 DIAGNOSIS — D0512 Intraductal carcinoma in situ of left breast: Secondary | ICD-10-CM | POA: Diagnosis not present

## 2020-11-14 DIAGNOSIS — Z86000 Personal history of in-situ neoplasm of breast: Secondary | ICD-10-CM | POA: Diagnosis not present

## 2020-11-14 DIAGNOSIS — Z923 Personal history of irradiation: Secondary | ICD-10-CM | POA: Diagnosis present

## 2020-11-14 LAB — CBC WITH DIFFERENTIAL (CANCER CENTER ONLY)
Abs Immature Granulocytes: 0.02 10*3/uL (ref 0.00–0.07)
Basophils Absolute: 0 10*3/uL (ref 0.0–0.1)
Basophils Relative: 0 %
Eosinophils Absolute: 0.1 10*3/uL (ref 0.0–0.5)
Eosinophils Relative: 1 %
HCT: 35.1 % — ABNORMAL LOW (ref 36.0–46.0)
Hemoglobin: 11.8 g/dL — ABNORMAL LOW (ref 12.0–15.0)
Immature Granulocytes: 0 %
Lymphocytes Relative: 27 %
Lymphs Abs: 2 10*3/uL (ref 0.7–4.0)
MCH: 29.1 pg (ref 26.0–34.0)
MCHC: 33.6 g/dL (ref 30.0–36.0)
MCV: 86.7 fL (ref 80.0–100.0)
Monocytes Absolute: 0.5 10*3/uL (ref 0.1–1.0)
Monocytes Relative: 7 %
Neutro Abs: 5 10*3/uL (ref 1.7–7.7)
Neutrophils Relative %: 65 %
Platelet Count: 259 10*3/uL (ref 150–400)
RBC: 4.05 MIL/uL (ref 3.87–5.11)
RDW: 12.8 % (ref 11.5–15.5)
WBC Count: 7.6 10*3/uL (ref 4.0–10.5)
nRBC: 0 % (ref 0.0–0.2)

## 2020-11-14 LAB — CMP (CANCER CENTER ONLY)
ALT: 14 U/L (ref 0–44)
AST: 15 U/L (ref 15–41)
Albumin: 3.7 g/dL (ref 3.5–5.0)
Alkaline Phosphatase: 61 U/L (ref 38–126)
Anion gap: 12 (ref 5–15)
BUN: 10 mg/dL (ref 8–23)
CO2: 22 mmol/L (ref 22–32)
Calcium: 9.2 mg/dL (ref 8.9–10.3)
Chloride: 107 mmol/L (ref 98–111)
Creatinine: 0.97 mg/dL (ref 0.44–1.00)
GFR, Estimated: >60 mL/min (ref >60)
Glucose, Bld: 119 mg/dL — ABNORMAL HIGH (ref 70–99)
Potassium: 3.8 mmol/L (ref 3.5–5.1)
Sodium: 141 mmol/L (ref 135–145)
Total Bilirubin: 0.7 mg/dL (ref 0.3–1.2)
Total Protein: 6.7 g/dL (ref 6.5–8.1)

## 2021-06-06 ENCOUNTER — Other Ambulatory Visit: Payer: Self-pay | Admitting: Internal Medicine

## 2021-06-06 ENCOUNTER — Ambulatory Visit (HOSPITAL_BASED_OUTPATIENT_CLINIC_OR_DEPARTMENT_OTHER)
Admission: RE | Admit: 2021-06-06 | Discharge: 2021-06-06 | Disposition: A | Discharge status: Home / Self Care | Payer: Medicare Other | Source: Ambulatory Visit | Attending: Internal Medicine | Admitting: Internal Medicine

## 2021-06-06 ENCOUNTER — Other Ambulatory Visit (HOSPITAL_BASED_OUTPATIENT_CLINIC_OR_DEPARTMENT_OTHER): Payer: Self-pay | Admitting: Internal Medicine

## 2021-06-06 DIAGNOSIS — R0609 Other forms of dyspnea: Secondary | ICD-10-CM | POA: Diagnosis present

## 2021-06-06 MED ORDER — IOHEXOL 350 MG/ML SOLN
100.0000 mL | Freq: Once | INTRAVENOUS | Status: AC | PRN
  Administered 2021-06-06: 75 mL via INTRAVENOUS

## 2021-07-10 ENCOUNTER — Institutional Professional Consult (permissible substitution): Payer: Medicare Other | Admitting: Pulmonary Disease

## 2021-07-11 NOTE — Progress Notes (Signed)
Cardiology Office Note:    Date:  07/12/2021   ID:  Jennifer Rodgers, Jennifer Rodgers 02-Apr-1955, MRN [id number]  PCP:  Jennifer Greathouse, MD   [facility] Providers Cardiologist:  Alverda Skeans, MD Referring MD: Jennifer Greathouse, MD   Chief Complaint/Reason for Referral: Dyspnea on exertion  ASSESSMENT:    1. Dyspnea, unspecified type   2. BMI 31.0-31.9,adult   3. Hypothyroidism, unspecified type   4. Precordial pain     PLAN:    In order of problems listed above: 1.  Dyspnea: We will obtain echocardiogram to evaluate further.  If reassuring then the patient will need to follow-up with pulmonology given her CT findings.  I will keep follow-up with me open-ended. 2.  Elevated BMI:  Consider pharmacotherapy. 3.  Hypothyroidism:  Free T4 recently within normal limits. 4.  Chest pain: We will obtain a coronary CTA and echocardiogram to evaluate further.  If the patient has mild obstructive coronary artery disease, they will require a statin (with goal LDL < 70) and aspirin, if they have high-grade disease we will need to consider optimal medical therapy and if symptoms are refractory to medical therapy, then a cardiac catheterization with possible PCI will be pursued to alleviate symptoms.  If they have high risk disease we will proceed directly to cardiac catheterization.  We will communicate with the patient's PCP for coronary CTA is reassuring so that she may be able to undergo a colonoscopy.    Dispo:  Return if symptoms worsen or fail to improve.      Medication Adjustments/Labs and Tests Ordered: Current medicines are reviewed at length with the patient today.  Concerns regarding medicines are outlined above.  The following changes have been made:  no change   Labs/tests ordered: Orders Placed This Encounter  Procedures   EKG 12-Lead    Medication Changes: No orders of the defined types were placed in this encounter.    Current medicines are reviewed at length with the  patient today.  The patient does not have concerns regarding medicines.   History of Present Illness:    FOCUSED PROBLEM LIST:   Breast cancer (DCIS) s/p Tamoxifen and XRT Hypothyroidism BMI 31  The patient is a 66 y.o. female with the indicated medical history here for recommendations regarding dyspnea on exertion.  The patient was seen by her primary care provider recently.  She noted increasing dyspnea when walking up stairs.  Currently she is able to walk on flat ground without any issues.  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  She was noted to have borderline cardiomegaly as well.  Laboratories were drawn which demonstrated a mildly low TSH however the free T4 was negative.  Her hemoglobin was 12.4.  Her other laboratories including sodium creatinine and potassium are all unremarkable.  The patient tells me that she has noticed increasing shortness of breath with minimal exertion.  She is also developed occasional deep chest and flank pain on occasion as well.  This seems to stop with rest.  She denies any severe bleeding but does have nuisance bruising.  She has occasional palpitations at times.  She denies any presyncope or syncope.  She has not required emergency room visits or hospitalizations for any reason.  She was supposed to see a pulmonologist regarding the CT findings last week but this was rescheduled for next week.  She was post have  a colonoscopy to follow-up on polyps but this was deferred until pulmonology and cardiology evaluations.   Current Medications: Current Meds  Medication Sig   Cinnamon 500 MG TABS Take by mouth.   Coconut Oil 1000 MG CAPS Take by mouth.   Fexofenadine HCl (ALLEGRA ALLERGY PO) Take by mouth.   levothyroxine (SYNTHROID) 112 MCG tablet Take 112 mcg by mouth daily.   pantoprazole (PROTONIX) 40 MG  tablet Take 40 mg by mouth daily.   vitamin C (ASCORBIC ACID) 250 MG tablet Take 250 mg by mouth 2 (two) times daily.   VITAMIN D, CHOLECALCIFEROL, PO Take 1,000 Units by mouth daily.      Allergies:    Prednisone   Social History:   Social History   Tobacco Use   Smoking status: Never   Smokeless tobacco: Never  Substance Use Topics   Alcohol use: No    Alcohol/week: 0.5 standard drinks of alcohol    Types: 1 Standard drinks or equivalent per week   Drug use: No     Family Hx: Family History  Problem Relation Age of Onset   Hypertension Mother    Thyroid disease Mother    Hyperlipidemia Mother    Breast cancer Mother 35   Hypertension Sister    Hyperlipidemia Sister    Diabetes Sister    Hypertension Daughter    Hyperlipidemia Daughter      Review of Systems:   Please see the history of present illness.    All other systems reviewed and are negative.     EKGs/Labs/Other Test Reviewed:    EKG:  EKG performed June 2023 that I personally reviewed demonstrates sinus rhythm  Prior CV studies: None available  Other studies Reviewed: Review of the additional studies/records demonstrates: Chest CT 2023 without pulmonary embolism or aortic atherosclerosis or coronary calcification  Recent Labs: External labs May 2023 demonstrate sodium 139 potassium 4.6 creatinine 0.8 LFTs within normal limits, hemoglobin 12.4, platelets 238, cholesterol 186, triglycerides 83, LDL 120, HDL 49, TSH 0.11 (low), free T4 1.4   Physical Exam:    VS:  BP 130/90 (BP Location: Left Arm, Patient Position: Sitting, Cuff Size: Normal)   Pulse 93   Ht 5\' 5"  (1.651 m)   Wt 192 lb (87.1 kg)   SpO2 95%   BMI 31.95 kg/m    Wt Readings from Last 3 Encounters:  07/12/21 192 lb (87.1 kg)  11/14/20 190 lb 8 oz (86.4 kg)  11/15/19 188 lb 6.4 oz (85.5 kg)    GENERAL:  No apparent distress, AOx3 HEENT:  No carotid bruits, +2 carotid impulses, no scleral icterus CAR: RRR no murmurs, gallops,  rubs, or thrills RES:  Clear to auscultation bilaterally ABD:  Soft, nontender, nondistended, positive bowel sounds x 4 VASC:  +2 radial pulses, +2 carotid pulses, palpable pedal pulses NEURO:  CN 2-12 grossly intact; motor and sensory grossly intact PSYCH:  No active depression or anxiety EXT:  No edema, ecchymosis, or cyanosis  Signed, Orbie Pyo, MD  07/12/2021 3:08 PM    [facility] [address] [place], [place], [place]  [address] Phone: [phone]; Fax: [phone]   Note:  This document was prepared using Dragon voice recognition software and may include unintentional dictation errors.

## 2021-07-12 ENCOUNTER — Encounter: Payer: Self-pay | Admitting: Internal Medicine

## 2021-07-12 ENCOUNTER — Ambulatory Visit: Payer: Medicare Other | Admitting: Internal Medicine

## 2021-07-12 VITALS — BP 130/90 | HR 93 | Ht 65.0 in | Wt 192.0 lb

## 2021-07-12 DIAGNOSIS — Z01812 Encounter for preprocedural laboratory examination: Secondary | ICD-10-CM

## 2021-07-12 DIAGNOSIS — R0600 Dyspnea, unspecified: Secondary | ICD-10-CM

## 2021-07-12 DIAGNOSIS — Z6831 Body mass index (BMI) 31.0-31.9, adult: Secondary | ICD-10-CM | POA: Diagnosis not present

## 2021-07-12 DIAGNOSIS — E039 Hypothyroidism, unspecified: Secondary | ICD-10-CM

## 2021-07-12 DIAGNOSIS — R072 Precordial pain: Secondary | ICD-10-CM

## 2021-07-12 MED ORDER — METOPROLOL TARTRATE 100 MG PO TABS: 100.0000 mg | ORAL_TABLET | Freq: Once | ORAL | 0 refills | Status: DC

## 2021-07-19 ENCOUNTER — Ambulatory Visit: Payer: Medicare Other | Admitting: Pulmonary Disease

## 2021-07-19 ENCOUNTER — Encounter: Payer: Self-pay | Admitting: Pulmonary Disease

## 2021-07-19 VITALS — BP 124/72 | HR 99 | Temp 98.5°F | Ht 65.0 in | Wt 191.6 lb

## 2021-07-19 DIAGNOSIS — R0609 Other forms of dyspnea: Secondary | ICD-10-CM | POA: Diagnosis not present

## 2021-07-19 NOTE — Patient Instructions (Addendum)
Nice to meet you  Based on your chest x-ray in 2006 and more recent CT scan, I am highly suspicious of asthma causing some of your symptoms of shortness of breath.  For further investigation, I recommend we do pulmonary function test.  We will try to get the scheduled for you in about 1 month with a visit with me the same day to discuss results.  It is likely that I will recommend an inhaler to use in the future, but lets see if the pulmonary function test demonstrate something else we should be concerned about or if it further increases our suspicion for asthma.  Return to clinic in 4 weeks with pulmonary function test same day as follow-up visit with Dr. Silas Flood afterwards

## 2021-07-19 NOTE — Progress Notes (Signed)
$'@Patient'F$  ID: Leodis Binet, female    DOB: November 11, 1955, 66 y.o.   MRN: [id number]  Chief Complaint  Patient presents with   Consult    Pt is here for consult for DOE. Pt states she only has DOE when her heart is pounding in her chest. She has seen cardio in the past. Echo scheduled and CT is scheduled. No inhalers noted. Needs to be cleared for procedure     Referring provider: [name], MD  HPI:   66 y.o. woman whom are seen in consultation for evaluation of dyspnea on exertion.  Note from referring provider reviewed.  Most recent cardiology note reviewed.  Patient reports longstanding history of dyspnea exertion.  Present for at least a couple years.  Likely longer.  Worse with household activity such as vacuuming.  Going up inclines or stairs.  Does fine at rest and with lighter tasks and on flat surfaces for short periods.  No time of day when things are better or worse.  No position to make things better or worse.  No other alleviating or exacerbating factors.  Other than rest.  She does not use inhalers.  She feels tachycardic, palpitations which she feels like is her limiting factor in terms of exertion.  She does endorse atopic symptoms including seasonal allergies.  Chronic use of antihistamines.  Denies history of asthma in the past.  Reviewed most recent chest x-ray 10/2004 that on my interpretation reveals evidence of hyperinflation on the AP and lateral film.  Most recent chest imaging CTA PE protocol 06/06/2021 revealed on my review interpretation scattered mosaicism throughout, clear parenchyma, no PE.  Per cardiology note, she has upcoming CTA coronary scan as well as echocardiogram planned.  PMH: Hypothyroidism Surgical history: Breast lumpectomy, lumbar laminectomy, ovary removal, shoulder surgery, thyroid surgery Family history: Mother with hypertension, hyperlipidemia, breast cancer Social history: Never smoker, lives in [place] /  Pulmonary Flowsheets:   ACT:      No data to display          MMRC:     No data to display          Epworth:      No data to display          Tests:   FENO:  No results found for: "NITRICOXIDE"  PFT:     No data to display          WALK:      No data to display          Imaging: Personally reviewed and as per EMR discussion this note No results found.  Lab Results: Personally reviewed CBC    Component Value Date/Time   WBC 7.6 11/14/2020 1255   WBC 4.3 09/16/2016 1310   WBC 5.3 01/28/2016 0810   RBC 4.05 11/14/2020 1255   HGB 11.8 (L) 11/14/2020 1255   HGB 12.2 09/16/2016 1310   HCT 35.1 (L) 11/14/2020 1255   HCT 35.9 09/16/2016 1310   PLT 259 11/14/2020 1255   PLT 250 09/16/2016 1310   MCV 86.7 11/14/2020 1255   MCV 86.9 09/16/2016 1310   MCH 29.1 11/14/2020 1255   MCHC 33.6 11/14/2020 1255   RDW 12.8 11/14/2020 1255   RDW 12.8 09/16/2016 1310   LYMPHSABS 2.0 11/14/2020 1255   LYMPHSABS 1.5 09/16/2016 1310   MONOABS 0.5 11/14/2020 1255   MONOABS 0.3 09/16/2016 1310   EOSABS 0.1 11/14/2020 1255   EOSABS 0.1 09/16/2016 1310  BASOSABS 0.0 11/14/2020 1255   BASOSABS 0.0 09/16/2016 1310    BMET    Component Value Date/Time   NA 141 11/14/2020 1255   NA 141 09/16/2016 1310   K 3.8 11/14/2020 1255   K 3.8 09/16/2016 1310   CL 107 11/14/2020 1255   CO2 22 11/14/2020 1255   CO2 28 09/16/2016 1310   GLUCOSE 119 (H) 11/14/2020 1255   GLUCOSE 112 09/16/2016 1310   BUN 10 11/14/2020 1255   BUN 13.8 09/16/2016 1310   CREATININE 0.97 11/14/2020 1255   CREATININE 0.8 09/16/2016 1310   CALCIUM 9.2 11/14/2020 1255   CALCIUM 9.7 09/16/2016 1310   GFRNONAA >60 11/14/2020 1255   GFRAA >60 11/12/2018 1247    BNP No results found for: "BNP"  ProBNP No results found for: "PROBNP"  Specialty Problems   None   Allergies  Allergen Reactions   Prednisone Other (See Comments)    Extreme facial flushing and confusion     Immunization History  Administered Date(s) Administered   Influenza Inj Mdck Quad With Preservative 11/26/2017   Influenza,inj,Quad PF,6+ Mos 10/06/2018   Influenza-Unspecified 09/21/2014   PFIZER(Purple Top)SARS-COV-2 Vaccination 03/20/2019, 04/13/2019, 01/06/2020   Zoster Recombinat (Shingrix) 04/08/2017, 07/01/2017    Past Medical History:  Diagnosis Date   Abdominal pain, unspecified site    Acute pharyngitis    Acute upper respiratory infections of unspecified site    Allergic rhinitis, cause unspecified    Breast cancer ([place]) 10/24/2015   Left breast    Degeneration of cervical intervertebral disc    Diaphragmatic hernia without mention of obstruction or gangrene    DOE (dyspnea on exertion)    Dysuria    Esophageal reflux    History of radiation therapy 03/18/16-04/15/16   left breast 42.72 Gy in 6 fractions, boost 10 Gy in 5 fractions   Migraine without aura, without mention of intractable migraine without mention of status migrainosus    Mild cardiomegaly    Nausea alone    Other constipation    Other malaise and fatigue    Personal history of radiation therapy 2018   Left Breast Cancer   PONV (postoperative nausea and vomiting)    Screening for depression    Unspecified hypothyroidism    Unspecified venous (peripheral) insufficiency    Urinary frequency     Tobacco History: Social History   Tobacco Use  Smoking Status Never  Smokeless Tobacco Never   Counseling given: Not Answered   Continue to not smoke  Outpatient Encounter Medications as of 07/19/2021  Medication Sig   Cinnamon 500 MG TABS Take by mouth.   Coconut Oil 1000 MG CAPS Take by mouth.   Fexofenadine HCl (ALLEGRA ALLERGY PO) Take by mouth.   levothyroxine (SYNTHROID) 112 MCG tablet Take 112 mcg by mouth daily.   pantoprazole (PROTONIX) 40 MG tablet Take 40 mg by mouth daily.   vitamin C (ASCORBIC ACID) 250 MG tablet Take 250 mg by mouth 2 (two) times daily.   VITAMIN D,  CHOLECALCIFEROL, PO Take 1,000 Units by mouth daily.    metoprolol tartrate (LOPRESSOR) 100 MG tablet Take 1 tablet (100 mg total) by mouth once for 1 dose. Take 90-120 minutes prior to scan.   No facility-administered encounter medications on file as of 07/19/2021.     Review of Systems  Review of Systems  No chest pain with exertion.  No orthopnea or PND.  Comprehensive review of systems otherwise negative. Physical Exam  BP 124/72 (BP Location: Right Arm,  Patient Position: Sitting, Cuff Size: Normal)   Pulse 99   Temp 98.5 F (36.9 C) (Oral)   Ht '5\' 5"'$  (1.651 m)   Wt 191 lb 9.6 oz (86.9 kg)   SpO2 98%   BMI 31.88 kg/m   Wt Readings from Last 5 Encounters:  07/19/21 191 lb 9.6 oz (86.9 kg)  07/12/21 192 lb (87.1 kg)  11/14/20 190 lb 8 oz (86.4 kg)  11/15/19 188 lb 6.4 oz (85.5 kg)  11/12/18 187 lb 12.8 oz (85.2 kg)    BMI Readings from Last 5 Encounters:  07/19/21 31.88 kg/m  07/12/21 31.95 kg/m  11/14/20 31.70 kg/m  11/15/19 31.35 kg/m  11/12/18 31.25 kg/m     Physical Exam General: Well-appearing, no acute distress Eyes: EOMI, icterus Neck: Supple: No JVP Pulmonary: Clear, no work of breathing Cardiovascular: Warm, no edema Abdomen: Nondistended, bowel sounds present MSK: No synovitis, no joint effusion Neuro: normal gait, no weakness Psych: Normal mood, full affect   Assessment & Plan:   Dyspnea on exertion: High suspicion for asthma given preceding chest x-ray in 2006 with hyperinflation, recent CT scan with mosaicism as well as atopic symptoms.  Given mosaicism, pulmonary hypertension is also on the differential.  She describe significant tachycardia so cardiac etiologies are also possible.  She is undergoing work-up and has scheduled CTA cardiac/coronary as well as echocardiogram in the coming weeks.  Deconditioning also likely contributing.  PFTs ordered for further evaluation.  Suspect would benefit from ICS/LABA therapy, counseled the same.  Will  await PFTs to see if there are any other etiologies we should consider in terms of contributing to symptoms and imaging findings prior to moving forward with empiric inhaler therapy.  Preprocedure evaluation for colonoscopy: Pulmonary medicine does not provide clearance rather risk assessment.  There is no well described risk assessment for colonoscopy.  This only really exists for surgical procedures.  No obvious absolute contraindication to colonoscopy.  Note routed to gastroenterologist.   Return in about 4 weeks (around 08/16/2021).   Lanier Clam, MD 07/19/2021

## 2021-08-01 ENCOUNTER — Ambulatory Visit (HOSPITAL_COMMUNITY): Payer: Medicare Other | Attending: Internal Medicine

## 2021-08-01 DIAGNOSIS — R0600 Dyspnea, unspecified: Secondary | ICD-10-CM | POA: Insufficient documentation

## 2021-08-01 LAB — ECHOCARDIOGRAM COMPLETE
Area-P 1/2: 3.85 cm2
S' Lateral: 2.8 cm

## 2021-08-02 ENCOUNTER — Telehealth (HOSPITAL_COMMUNITY): Payer: Self-pay | Admitting: Emergency Medicine

## 2021-08-02 NOTE — Telephone Encounter (Signed)
Reaching out to patient to offer assistance regarding upcoming cardiac imaging study; pt verbalizes understanding of appt date/time, parking situation and where to check in, pre-test NPO status and medications ordered, and verified current allergies; name and call back number provided for further questions should they arise Marchia Bond RN Navigator Cardiac Imaging [facility] [phone] office [phone] cell  '100mg'$  metoprolol tartrate Denies iv issues Arrival 1130

## 2021-08-05 ENCOUNTER — Ambulatory Visit (HOSPITAL_COMMUNITY)
Admission: RE | Admit: 2021-08-05 | Discharge: 2021-08-05 | Disposition: A | Discharge status: Home / Self Care | Payer: Medicare Other | Source: Ambulatory Visit | Attending: Internal Medicine | Admitting: Internal Medicine

## 2021-08-05 DIAGNOSIS — R072 Precordial pain: Secondary | ICD-10-CM | POA: Diagnosis present

## 2021-08-05 MED ORDER — IOHEXOL 350 MG/ML SOLN
100.0000 mL | Freq: Once | INTRAVENOUS | Status: AC | PRN
  Administered 2021-08-05: 100 mL via INTRAVENOUS

## 2021-08-05 MED ORDER — NITROGLYCERIN 0.4 MG SL SUBL
0.8000 mg | SUBLINGUAL_TABLET | Freq: Once | SUBLINGUAL | Status: AC
  Administered 2021-08-05: 0.8 mg via SUBLINGUAL

## 2021-08-05 MED ORDER — NITROGLYCERIN 0.4 MG SL SUBL
SUBLINGUAL_TABLET | SUBLINGUAL | Status: AC
  Filled 2021-08-05: qty 2

## 2021-08-20 ENCOUNTER — Encounter: Payer: Self-pay | Admitting: Pulmonary Disease

## 2021-08-20 ENCOUNTER — Ambulatory Visit (INDEPENDENT_AMBULATORY_CARE_PROVIDER_SITE_OTHER): Payer: Medicare Other | Admitting: Pulmonary Disease

## 2021-08-20 ENCOUNTER — Ambulatory Visit: Payer: Medicare Other | Admitting: Pulmonary Disease

## 2021-08-20 VITALS — BP 130/76 | HR 105 | Temp 98.6°F | Ht 64.5 in | Wt 193.6 lb

## 2021-08-20 DIAGNOSIS — R0609 Other forms of dyspnea: Secondary | ICD-10-CM

## 2021-08-20 LAB — PULMONARY FUNCTION TEST
DL/VA % pred: 112 %
DL/VA: 4.68 ml/min/mmHg/L
DLCO cor % pred: 115 %
DLCO cor: 23.13 ml/min/mmHg
DLCO unc % pred: 115 %
DLCO unc: 23.13 ml/min/mmHg
FEF 25-75 Post: 3.06 L/sec
FEF 25-75 Pre: 2.22 L/sec
FEF2575-%Change-Post: 38 %
FEF2575-%Pred-Post: 148 %
FEF2575-%Pred-Pre: 107 %
FEV1-%Change-Post: 9 %
FEV1-%Pred-Post: 105 %
FEV1-%Pred-Pre: 96 %
FEV1-Post: 2.54 L
FEV1-Pre: 2.33 L
FEV1FVC-%Change-Post: 5 %
FEV1FVC-%Pred-Pre: 103 %
FEV6-%Change-Post: 3 %
FEV6-%Pred-Post: 100 %
FEV6-%Pred-Pre: 97 %
FEV6-Post: 3.03 L
FEV6-Pre: 2.94 L
FEV6FVC-%Change-Post: 0 %
FEV6FVC-%Pred-Post: 104 %
FEV6FVC-%Pred-Pre: 104 %
FVC-%Change-Post: 3 %
FVC-%Pred-Post: 96 %
FVC-%Pred-Pre: 93 %
FVC-Post: 3.04 L
FVC-Pre: 2.94 L
Post FEV1/FVC ratio: 84 %
Post FEV6/FVC ratio: 100 %
Pre FEV1/FVC ratio: 79 %
Pre FEV6/FVC Ratio: 100 %
RV % pred: 102 %
RV: 2.2 L
TLC % pred: 104 %
TLC: 5.36 L

## 2021-08-20 NOTE — Progress Notes (Signed)
$'@Patient'u$  ID: Jennifer Rodgers, female    DOB: 07/13/1955, 66 y.o.   MRN: [id number]  Chief Complaint  Patient presents with   Follow-up    Pt is here for follow up for DOE. Pt had full PFTs done today. Pt is not on any active inhalers. Pt states no changes with her breathing.  Echo done last month 7/13.    Referring provider: [name], MD  HPI:   66 y.o. woman whom are seen in follow up for evaluation of dyspnea on exertion.    Returns to clinic for routine follow-up.  PFTs performed today.  Reviewed and discussed with patient in detail.  Please see below for full interpretation but these reveal normal PFTs.  In general, today she denies any really significant dyspnea.  More when she needs to rush or go fast.  [place] an [place] at a time at a relatively slow pace.  No issues.  She feels like her heart beats fast needs to stop.  We discussed at length that there is a limit at heart rate with exertion and that high heart rate is a normal response to maximal exertion.  Discussed likely the best thing moving forward would be graduated for gradual increase in exercise regimen to build up cardiovascular tolerance.  In interim since last visit she had an echocardiogram performed which is reviewed and demonstrates normal LV systolic function, grade 1 diastolic dysfunction, normal left atrium size, normal right atrium size, normal RV size and function.  She had CT coronary scan in interim that reveals a very low calcium score.  On my review interpretation of the available lung images this reveals clear lungs bilaterally.  HPI initial visit: Patient reports longstanding history of dyspnea exertion.  Present for at least a couple years.  Likely longer.  Worse with household activity such as vacuuming.  Going up inclines or stairs.  Does fine at rest and with lighter tasks and on flat surfaces for short periods.  No time of day when things are better or worse.  No position to  make things better or worse.  No other alleviating or exacerbating factors.  Other than rest.  She does not use inhalers.  She feels tachycardic, palpitations which she feels like is her limiting factor in terms of exertion.  She does endorse atopic symptoms including seasonal allergies.  Chronic use of antihistamines.  Denies history of asthma in the past.  Reviewed most recent chest x-ray 10/2004 that on my interpretation reveals evidence of hyperinflation on the AP and lateral film.  Most recent chest imaging CTA PE protocol 06/06/2021 revealed on my review interpretation scattered mosaicism throughout, clear parenchyma, no PE.  Per cardiology note, she has upcoming CTA coronary scan as well as echocardiogram planned.  PMH: Hypothyroidism Surgical history: Breast lumpectomy, lumbar laminectomy, ovary removal, shoulder surgery, thyroid surgery Family history: Mother with hypertension, hyperlipidemia, breast cancer Social history: Never smoker, lives in [place] Pulmonary Flowsheets:   ACT:      No data to display           MMRC:     No data to display           Epworth:      No data to display           Tests:   FENO:  No results found for: "NITRICOXIDE"  PFT:    Latest Ref Rng & Units 08/20/2021   10:38  AM  PFT Results  FVC-Pre L 2.94  P  FVC-Predicted Pre % 93  P  FVC-Post L 3.04  P  FVC-Predicted Post % 96  P  Pre FEV1/FVC % % 79  P  Post FEV1/FCV % % 84  P  FEV1-Pre L 2.33  P  FEV1-Predicted Pre % 96  P  FEV1-Post L 2.54  P  DLCO uncorrected ml/min/mmHg 23.13  P  DLCO UNC% % 115  P  DLCO corrected ml/min/mmHg 23.13  P  DLCO COR %Predicted % 115  P  DLVA Predicted % 112  P  TLC L 5.36  P  TLC % Predicted % 104  P  RV % Predicted % 102  P    P Preliminary result   Personally reviewed and interpreted as normal spirometry, 210 cc increase in FEV1 but does not meet 12% threshold, only 9% increase, so therefore no significant  bronchodilator response.  TLC within normal limits, no evidence of air trapping.  DLCO normal to slightly increased.  WALK:      No data to display           Imaging: Personally reviewed and as per EMR discussion this note CT CORONARY MORPH W/CTA COR W/SCORE W/CA W/CM &/OR WO/CM  Addendum Date: 08/05/2021   ADDENDUM REPORT: 08/05/2021 14:27 EXAM: OVER-READ INTERPRETATION  CT CHEST The following report is a limited chest CT over-read performed by radiologist Dr. Lindaann Slough [facility] on 08/05/2021. This over-read does not include interpretation of cardiac or coronary anatomy or pathology. The coronary CTA interpretation by the cardiologist is attached. COMPARISON:  Chest CT 06/06/2021 FINDINGS: Mediastinum/Nodes: No enlarged lymph nodes within the visualized mediastinum.There is a small hiatal hernia. Lungs/Pleura: There is no pleural effusion. The visualized lungs appear clear. Upper abdomen: No significant findings in the visualized upper abdomen. Musculoskeletal/Chest wall: No chest wall mass or suspicious osseous findings within the visualized chest. Mild thoracic spondylosis. IMPRESSION: No significant extracardiac findings within the visualized lower chest. Electronically Signed   By: Richardean Sale M.D.   On: 08/05/2021 14:27   Result Date: 08/05/2021 CLINICAL DATA:  Chest pain EXAM: Cardiac/Coronary CTA TECHNIQUE: A non-contrast, gated CT scan was obtained with axial slices of 3 mm through the heart for calcium scoring. Calcium scoring was performed using the Agatston method. A 90 kV prospective, gated, contrast cardiac scan was obtained. Gantry rotation speed was 250 msecs and collimation was 0.6 mm. Two sublingual nitroglycerin tablets (0.8 mg) were given. The 3D data set was reconstructed in 5% intervals of the 35-75% of the R-R cycle. Diastolic phases were analyzed on a dedicated workstation using MPR, MIP, and VRT modes. The patient received 95 cc of contrast.  FINDINGS: Image quality: Excellent. Noise artifact is: Limited. Coronary Arteries:  Normal coronary origin.  Right dominance. Left main: The left main is a large caliber vessel with a normal take off from the left coronary cusp that bifurcates to form a left anterior descending artery and a left circumflex artery. There is no plaque or stenosis. Left anterior descending artery: The LAD is patent without evidence of plaque or stenosis. Mid LAD myocardial bridge (normal variant). The LAD gives off 1 patent diagonal branch. Left circumflex artery: The LCX is non-dominant and patent with no evidence of plaque or stenosis. The LCX gives off 2 patent obtuse marginal branches. Right coronary artery: The RCA is dominant with normal take off from the right coronary cusp. There is no evidence of plaque or stenosis. The RCA  terminates as a PDA and right posterolateral branch without evidence of plaque or stenosis. Right Atrium: Right atrial size is within normal limits. Right Ventricle: The right ventricular cavity is within normal limits. Left Atrium: Left atrial size is normal in size with no left atrial appendage filling defect. Small PFO. Left Ventricle: The ventricular cavity size is within normal limits. Pulmonary arteries: Normal in size without proximal filling defect. Pulmonary veins: Normal pulmonary venous drainage. Pericardium: Normal thickness without significant effusion or calcium present. Cardiac valves: The aortic valve is trileaflet without significant calcification. The mitral valve is normal without significant calcification. Aorta: Normal caliber without significant disease. Extra-cardiac findings: See attached radiology report for non-cardiac structures. IMPRESSION: 1. Coronary calcium score of 0. 2. Normal coronary origin with right dominance. 3. Normal coronary arteries. 4. Mid LAD myocardial bridge (normal variant). 5. Small PFO. RECOMMENDATIONS: 1. No evidence of CAD (0%). Consider non-atherosclerotic  causes of chest pain. Eleonore Chiquito, MD Electronically Signed: By: Eleonore Chiquito M.D. On: 08/05/2021 13:50   ECHOCARDIOGRAM COMPLETE  Result Date: 08/01/2021    ECHOCARDIOGRAM REPORT   Patient Name:   Jennifer Rodgers Date of Exam: 08/01/2021 Medical Rec #:  [id number]          Height:       65.0 in Accession #:    [id number]         Weight:       191.6 lb Date of Birth:  09-11-1955          BSA:          1.942 m Patient Age:    32 years           BP:           124/72 mmHg Patient Gender: F                  HR:           82 bpm. Exam Location:  [place] Procedure: 2D Echo, 3D Echo, Cardiac Doppler, Color Doppler and Strain Analysis Indications:    R06.00 Dyspnea  History:        Patient has no prior history of Echocardiogram examinations.                 Cardiomegaly; Signs/Symptoms:Fatigue and Dyspnea. History of                 breast cancer. Hypothyroidism.  Sonographer:    Cresenciano Lick RDCS Referring Phys: [id number] [place]  1. Left ventricular ejection fraction, by estimation, is 60 to 65%. The left ventricle has normal function. The left ventricle has no regional wall motion abnormalities. Left ventricular diastolic parameters are consistent with Grade I diastolic dysfunction (impaired relaxation). The average left ventricular global longitudinal strain is -22.9 %. The global longitudinal strain is normal.  2. Right ventricular systolic function is normal. The right ventricular size is normal. Tricuspid regurgitation signal is inadequate for assessing PA pressure.  3. The mitral valve is normal in structure. No evidence of mitral valve regurgitation.  4. The aortic valve was not well visualized. Aortic valve regurgitation is not visualized.  5. No significant aortic root or ascending aneurysm.  6. The inferior vena cava is normal in size with greater than 50% respiratory variability, suggesting right atrial pressure of 3 mmHg. Comparison(s): No prior Echocardiogram.  Conclusion(s)/Recommendation(s): Normal biventricular function without evidence of hemodynamically significant valvular heart disease. FINDINGS  Left Ventricle: Left ventricular ejection fraction, by estimation, is 60  to 65%. The left ventricle has normal function. The left ventricle has no regional wall motion abnormalities. The average left ventricular global longitudinal strain is -22.9 %. The global longitudinal strain is normal. The left ventricular internal cavity size was normal in size. There is no left ventricular hypertrophy. Left ventricular diastolic parameters are consistent with Grade I diastolic dysfunction (impaired relaxation). Right Ventricle: The right ventricular size is normal. No increase in right ventricular wall thickness. Right ventricular systolic function is normal. Tricuspid regurgitation signal is inadequate for assessing PA pressure. Left Atrium: Left atrial size was normal in size. Right Atrium: Right atrial size was normal in size. Pericardium: There is no evidence of pericardial effusion. Mitral Valve: The mitral valve is normal in structure. No evidence of mitral valve regurgitation. Tricuspid Valve: The tricuspid valve is normal in structure. Tricuspid valve regurgitation is not demonstrated. Aortic Valve: The aortic valve was not well visualized. Aortic valve regurgitation is not visualized. Pulmonic Valve: The pulmonic valve was not well visualized. Pulmonic valve regurgitation is not visualized. Aorta: No significant aortic root or ascending aneurysm. Venous: The inferior vena cava is normal in size with greater than 50% respiratory variability, suggesting right atrial pressure of 3 mmHg. IAS/Shunts: No atrial level shunt detected by color flow Doppler.  LEFT VENTRICLE PLAX 2D LVIDd:         4.10 cm   Diastology LVIDs:         2.80 cm   LV e' medial:    6.09 cm/s LV PW:         0.80 cm   LV E/e' medial:  8.5 LV IVS:        0.80 cm   LV e' lateral:   9.90 cm/s LVOT diam:     1.70  cm   LV E/e' lateral: 5.2 LV SV:         44 LV SV Index:   22        2D Longitudinal Strain LVOT Area:     2.27 cm  2D Strain GLS (A2C):   -22.9 %                          2D Strain GLS (A3C):   -22.5 %                          2D Strain GLS (A4C):   -23.3 %                          2D Strain GLS Avg:     -22.9 %                           3D Volume EF:                          3D EF:        55 %                          LV EDV:       109 ml                          LV ESV:       49 ml  LV SV:        60 ml RIGHT VENTRICLE             IVC RV Basal diam:  3.30 cm     IVC diam: 1.20 cm RV S prime:     14.17 cm/s TAPSE (M-mode): 2.7 cm LEFT ATRIUM             Index        RIGHT ATRIUM           Index LA diam:        3.30 cm 1.70 cm/m   RA Area:     11.20 cm LA Vol (A2C):   36.0 ml 18.53 ml/m  RA Volume:   27.60 ml  14.21 ml/m LA Vol (A4C):   24.2 ml 12.46 ml/m LA Biplane Vol: 29.8 ml 15.34 ml/m  AORTIC VALVE LVOT Vmax:   105.00 cm/s LVOT Vmean:  73.667 cm/s LVOT VTI:    0.192 m  AORTA Ao Root diam: 3.10 cm Ao Asc diam:  3.50 cm MITRAL VALVE MV Area (PHT): 3.85 cm    SHUNTS MV Decel Time: 197 msec    Systemic VTI:  0.19 m MV E velocity: 51.80 cm/s  Systemic Diam: 1.70 cm MV A velocity: 69.10 cm/s MV E/A ratio:  0.75 Phineas Inches Electronically signed by Phineas Inches Signature Date/Time: 08/01/2021/11:26:45 AM    Final     Lab Results: Personally reviewed CBC    Component Value Date/Time   WBC 7.6 11/14/2020 1255   WBC 4.3 09/16/2016 1310   WBC 5.3 01/28/2016 0810   RBC 4.05 11/14/2020 1255   HGB 11.8 (L) 11/14/2020 1255   HGB 12.2 09/16/2016 1310   HCT 35.1 (L) 11/14/2020 1255   HCT 35.9 09/16/2016 1310   PLT 259 11/14/2020 1255   PLT 250 09/16/2016 1310   MCV 86.7 11/14/2020 1255   MCV 86.9 09/16/2016 1310   MCH 29.1 11/14/2020 1255   MCHC 33.6 11/14/2020 1255   RDW 12.8 11/14/2020 1255   RDW 12.8 09/16/2016 1310   LYMPHSABS 2.0 11/14/2020 1255   LYMPHSABS 1.5  09/16/2016 1310   MONOABS 0.5 11/14/2020 1255   MONOABS 0.3 09/16/2016 1310   EOSABS 0.1 11/14/2020 1255   EOSABS 0.1 09/16/2016 1310   BASOSABS 0.0 11/14/2020 1255   BASOSABS 0.0 09/16/2016 1310    BMET    Component Value Date/Time   NA 141 11/14/2020 1255   NA 141 09/16/2016 1310   K 3.8 11/14/2020 1255   K 3.8 09/16/2016 1310   CL 107 11/14/2020 1255   CO2 22 11/14/2020 1255   CO2 28 09/16/2016 1310   GLUCOSE 119 (H) 11/14/2020 1255   GLUCOSE 112 09/16/2016 1310   BUN 10 11/14/2020 1255   BUN 13.8 09/16/2016 1310   CREATININE 0.97 11/14/2020 1255   CREATININE 0.8 09/16/2016 1310   CALCIUM 9.2 11/14/2020 1255   CALCIUM 9.7 09/16/2016 1310   GFRNONAA >60 11/14/2020 1255   GFRAA >60 11/12/2018 1247    BNP No results found for: "BNP"  ProBNP No results found for: "PROBNP"  Specialty Problems   None   Allergies  Allergen Reactions   Prednisone Other (See Comments)    Extreme facial flushing and confusion    Immunization History  Administered Date(s) Administered   Influenza Inj Mdck Quad With Preservative 11/26/2017   Influenza,inj,Quad PF,6+ Mos 10/06/2018   Influenza-Unspecified 09/21/2014   PFIZER(Purple Top)SARS-COV-2 Vaccination 03/20/2019, 04/13/2019, 01/06/2020   Zoster Recombinat (Shingrix) 04/08/2017, 07/01/2017  Past Medical History:  Diagnosis Date   Abdominal pain, unspecified site    Acute pharyngitis    Acute upper respiratory infections of unspecified site    Allergic rhinitis, cause unspecified    Breast cancer ([place]) 10/24/2015   Left breast    Degeneration of cervical intervertebral disc    Diaphragmatic hernia without mention of obstruction or gangrene    DOE (dyspnea on exertion)    Dysuria    Esophageal reflux    History of radiation therapy 03/18/16-04/15/16   left breast 42.72 Gy in 6 fractions, boost 10 Gy in 5 fractions   Migraine without aura, without mention of intractable migraine without mention of status migrainosus     Mild cardiomegaly    Nausea alone    Other constipation    Other malaise and fatigue    Personal history of radiation therapy 2018   Left Breast Cancer   PONV (postoperative nausea and vomiting)    Screening for depression    Unspecified hypothyroidism    Unspecified venous (peripheral) insufficiency    Urinary frequency     Tobacco History: Social History   Tobacco Use  Smoking Status Never  Smokeless Tobacco Never   Counseling given: Not Answered   Continue to not smoke  Outpatient Encounter Medications as of 08/20/2021  Medication Sig   Cinnamon 500 MG TABS Take by mouth.   Coconut Oil 1000 MG CAPS Take by mouth.   Fexofenadine HCl (ALLEGRA ALLERGY PO) Take by mouth.   levothyroxine (SYNTHROID) 112 MCG tablet Take 112 mcg by mouth daily.   pantoprazole (PROTONIX) 40 MG tablet Take 40 mg by mouth daily.   vitamin C (ASCORBIC ACID) 250 MG tablet Take 250 mg by mouth 2 (two) times daily.   VITAMIN D, CHOLECALCIFEROL, PO Take 1,000 Units by mouth daily.    [DISCONTINUED] metoprolol tartrate (LOPRESSOR) 100 MG tablet Take 1 tablet (100 mg total) by mouth once for 1 dose. Take 90-120 minutes prior to scan.   No facility-administered encounter medications on file as of 08/20/2021.     Review of Systems  Review of Systems  N/a Physical Exam  BP 130/76 (BP Location: Left Arm, Patient Position: Sitting, Cuff Size: Normal)   Pulse (!) 105   Temp 98.6 F (37 C) (Oral)   Ht 5' 4.5" (1.638 m)   Wt 193 lb 9.6 oz (87.8 kg)   SpO2 92%   BMI 32.72 kg/m   Wt Readings from Last 5 Encounters:  08/20/21 193 lb 9.6 oz (87.8 kg)  07/19/21 191 lb 9.6 oz (86.9 kg)  07/12/21 192 lb (87.1 kg)  11/14/20 190 lb 8 oz (86.4 kg)  11/15/19 188 lb 6.4 oz (85.5 kg)    BMI Readings from Last 5 Encounters:  08/20/21 32.72 kg/m  07/19/21 31.88 kg/m  07/12/21 31.95 kg/m  11/14/20 31.70 kg/m  11/15/19 31.35 kg/m     Physical Exam General: Well-appearing, no acute distress Eyes:  EOMI, icterus Neck: Supple: No JVP Pulmonary: Clear, normal work of breathing Cardiovascular: Warm, no edema Abdomen: Nondistended, bowel sounds present MSK: No synovitis, no joint effusion Neuro: normal gait, no weakness Psych: Normal mood, full affect   Assessment & Plan:   Dyspnea on exertion: There is suspicion suspicion for asthma given preceding chest x-ray in 2006 with hyperinflation, recent CT scan with mosaicism as well as atopic symptoms.  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  She does describe  significant tachycardia and that seems to be the most limiting factor in terms of her dyspnea.  Her cardiac work-up to date has all been reassuring and normal.  Do suspect that this is a normal response to exercise and that most of her symptoms are likely related to deconditioning.  Discussed and recommended a graduated exercise program to slowly increase cardiovascular endurance.  Given suspicion for asthma, could consider bronchodilator therapy in the future if not improving.  Preprocedure evaluation for colonoscopy: Pulmonary medicine does not provide clearance rather risk assessment.  There is no well described risk assessment for colonoscopy.  This only really exists for surgical procedures.  No obvious absolute contraindication to colonoscopy.     Return in about 6 months (around 02/20/2022).   Lanier Clam, MD 08/20/2021

## 2021-08-20 NOTE — Patient Instructions (Signed)
Nice to see you again  Your pulmonary function test today are normal, this is great news  In the future we may consider using inhalers but for now no medication changes  Recommend slowly increasing cardiovascular exercise.  Maybe start walking at a good clip 15 minutes at least 5 days a week.  Then every week or 2 as it seems easier, increase the duration by 2 to 3 minutes.  Return to clinic in 6 months or sooner as needed with Dr. Silas Flood

## 2021-08-20 NOTE — Progress Notes (Signed)
PFT done today. 

## 2021-10-30 ENCOUNTER — Other Ambulatory Visit: Payer: Self-pay | Admitting: Internal Medicine

## 2021-10-30 DIAGNOSIS — Z1231 Encounter for screening mammogram for malignant neoplasm of breast: Secondary | ICD-10-CM

## 2021-12-05 ENCOUNTER — Ambulatory Visit
Admission: RE | Admit: 2021-12-05 | Discharge: 2021-12-05 | Disposition: A | Discharge status: Home / Self Care | Payer: Medicare Other | Source: Ambulatory Visit | Attending: Internal Medicine | Admitting: Internal Medicine

## 2021-12-05 DIAGNOSIS — Z1231 Encounter for screening mammogram for malignant neoplasm of breast: Secondary | ICD-10-CM

## 2021-12-06 ENCOUNTER — Ambulatory Visit: Payer: Medicare Other

## 2022-02-25 ENCOUNTER — Encounter: Payer: Self-pay | Admitting: Pulmonary Disease

## 2022-02-25 ENCOUNTER — Ambulatory Visit: Payer: Medicare Other | Admitting: Pulmonary Disease

## 2022-02-25 VITALS — BP 126/70 | HR 98 | Ht 65.0 in | Wt 192.0 lb

## 2022-02-25 DIAGNOSIS — R0609 Other forms of dyspnea: Secondary | ICD-10-CM

## 2022-02-25 NOTE — Progress Notes (Signed)
$'@Patient'B$  ID: Jennifer Rodgers, female    DOB: [birth year], 67 y.o.   MRN: [id number]  Chief Complaint  Patient presents with   Follow-up    Pt is here for follow up for DOE. Pt states she was feeling good and then a few weeks ago she got covid and now she feels bad again.     Referring provider: [name], MD  HPI:   67 y.o. woman whom are seen in follow up for evaluation of dyspnea on exertion.    Has been doing well.  Unfortunate, contracted COVID on recent cruise.  01/29/2022.  Within a couple days had severe cough, upper respiratory viral illness symptoms.  Overall, she is doing better.  However this cough is lingered.  Worse in the mornings.  Productive.  Present throughout the day.  She notes she had COVID in the fall 2020 and she took many weeks to get over that illness, lingering cough for weeks.  She denies any fever.  No worsening shortness of breath.  Prior to this, with graduated exercise the  dyspnea on exertion that we are initially evaluating her for had greatly improved and was not an issue.  HPI initial visit: Patient reports longstanding history of dyspnea exertion.  Present for at least a couple years.  Likely longer.  Worse with household activity such as vacuuming.  Going up inclines or stairs.  Does fine at rest and with lighter tasks and on flat surfaces for short periods.  No time of day when things are better or worse.  No position to make things better or worse.  No other alleviating or exacerbating factors.  Other than rest.  She does not use inhalers.  She feels tachycardic, palpitations which she feels like is her limiting factor in terms of exertion.  She does endorse atopic symptoms including seasonal allergies.  Chronic use of antihistamines.  Denies history of asthma in the past.  Reviewed most recent chest x-ray 10/2004 that on my interpretation reveals evidence of hyperinflation on the AP and lateral film.  Most recent chest imaging CTA PE protocol  06/06/2021 revealed on my review interpretation scattered mosaicism throughout, clear parenchyma, no PE.  Per cardiology note, she has upcoming CTA coronary scan as well as echocardiogram planned.  PMH: Hypothyroidism Surgical history: Breast lumpectomy, lumbar laminectomy, ovary removal, shoulder surgery, thyroid surgery Family history: Mother with hypertension, hyperlipidemia, breast cancer Social history: Never smoker, [place] / Pulmonary Flowsheets:   ACT:      No data to display          MMRC:     No data to display          Epworth:      No data to display          Tests:   FENO:  No results found for: "NITRICOXIDE"  PFT:    Latest Ref Rng & Units 08/20/2021   10:38 AM  PFT Results  FVC-Pre L 2.94   FVC-Predicted Pre % 93   FVC-Post L 3.04   FVC-Predicted Post % 96   Pre FEV1/FVC % % 79   Post FEV1/FCV % % 84   FEV1-Pre L 2.33   FEV1-Predicted Pre % 96   FEV1-Post L 2.54   DLCO uncorrected ml/min/mmHg 23.13   DLCO UNC% % 115   DLCO corrected ml/min/mmHg 23.13   DLCO COR %Predicted % 115   DLVA Predicted % 112   TLC L 5.36  TLC % Predicted % 104   RV % Predicted % 102   Personally reviewed and interpreted as normal spirometry, 210 cc increase in FEV1 but does not meet 12% threshold, only 9% increase, so therefore no significant bronchodilator response.  TLC within normal limits, no evidence of air trapping.  DLCO normal to slightly increased.  WALK:      No data to display          Imaging: Personally reviewed and as per EMR discussion this note No results found.  Lab Results: Personally reviewed CBC    Component Value Date/Time   WBC 7.6 11/14/2020 1255   WBC 4.3 09/16/2016 1310   WBC 5.3 01/28/2016 0810   RBC 4.05 11/14/2020 1255   HGB 11.8 (L) 11/14/2020 1255   HGB 12.2 09/16/2016 1310   HCT 35.1 (L) 11/14/2020 1255   HCT 35.9 09/16/2016 1310   PLT 259 11/14/2020 1255   PLT 250 09/16/2016  1310   MCV 86.7 11/14/2020 1255   MCV 86.9 09/16/2016 1310   MCH 29.1 11/14/2020 1255   MCHC 33.6 11/14/2020 1255   RDW 12.8 11/14/2020 1255   RDW 12.8 09/16/2016 1310   LYMPHSABS 2.0 11/14/2020 1255   LYMPHSABS 1.5 09/16/2016 1310   MONOABS 0.5 11/14/2020 1255   MONOABS 0.3 09/16/2016 1310   EOSABS 0.1 11/14/2020 1255   EOSABS 0.1 09/16/2016 1310   BASOSABS 0.0 11/14/2020 1255   BASOSABS 0.0 09/16/2016 1310    BMET    Component Value Date/Time   NA 141 11/14/2020 1255   NA 141 09/16/2016 1310   K 3.8 11/14/2020 1255   K 3.8 09/16/2016 1310   CL 107 11/14/2020 1255   CO2 22 11/14/2020 1255   CO2 28 09/16/2016 1310   GLUCOSE 119 (H) 11/14/2020 1255   GLUCOSE 112 09/16/2016 1310   BUN 10 11/14/2020 1255   BUN 13.8 09/16/2016 1310   CREATININE 0.97 11/14/2020 1255   CREATININE 0.8 09/16/2016 1310   CALCIUM 9.2 11/14/2020 1255   CALCIUM 9.7 09/16/2016 1310   GFRNONAA >60 11/14/2020 1255   GFRAA >60 11/12/2018 1247    BNP No results found for: "BNP"  ProBNP No results found for: "PROBNP"  Specialty Problems   None   Allergies  Allergen Reactions   Prednisone Other (See Comments)    Extreme facial flushing and confusion    Immunization History  Administered Date(s) Administered   Influenza Inj Mdck Quad With Preservative 11/26/2017   Influenza,inj,Quad PF,6+ Mos 10/06/2018   Influenza-Unspecified 09/21/2014   PFIZER(Purple Top)SARS-COV-2 Vaccination 03/20/2019, 04/13/2019, 01/06/2020   Zoster Recombinat (Shingrix) 04/08/2017, 07/01/2017    Past Medical History:  Diagnosis Date   Abdominal pain, unspecified site    Acute pharyngitis    Acute upper respiratory infections of unspecified site    Allergic rhinitis, cause unspecified    Breast cancer ([place]) 10/24/2015   Left breast    Degeneration of cervical intervertebral disc    Diaphragmatic hernia without mention of obstruction or gangrene    DOE (dyspnea on exertion)    Dysuria    Esophageal reflux     History of radiation therapy 03/18/16-04/15/16   left breast 42.72 Gy in 6 fractions, boost 10 Gy in 5 fractions   Migraine without aura, without mention of intractable migraine without mention of status migrainosus    Mild cardiomegaly    Nausea alone    Other constipation    Other malaise and fatigue    Personal history of radiation therapy 2018  Left Breast Cancer   PONV (postoperative nausea and vomiting)    Screening for depression    Unspecified hypothyroidism    Unspecified venous (peripheral) insufficiency    Urinary frequency     Tobacco History: Social History   Tobacco Use  Smoking Status Never  Smokeless Tobacco Never   Counseling given: Not Answered   Continue to not smoke  Outpatient Encounter Medications as of 02/25/2022  Medication Sig   levothyroxine (SYNTHROID) 112 MCG tablet Take 112 mcg by mouth daily.   meloxicam (MOBIC) 15 MG tablet Take 15 mg by mouth daily.   pantoprazole (PROTONIX) 40 MG tablet Take 40 mg by mouth daily.   vitamin C (ASCORBIC ACID) 250 MG tablet Take 250 mg by mouth 2 (two) times daily.   VITAMIN D, CHOLECALCIFEROL, PO Take 1,000 Units by mouth daily.    [DISCONTINUED] Cinnamon 500 MG TABS Take by mouth.   [DISCONTINUED] Coconut Oil 1000 MG CAPS Take by mouth.   [DISCONTINUED] Fexofenadine HCl (ALLEGRA ALLERGY PO) Take by mouth.   No facility-administered encounter medications on file as of 02/25/2022.     Review of Systems  Review of Systems  N/a Physical Exam  BP 126/70 (BP Location: Left Arm, Patient Position: Sitting, Cuff Size: Normal)   Pulse 98   Ht '5\' 5"'$  (1.651 m)   Wt 192 lb (87.1 kg)   SpO2 99%   BMI 31.95 kg/m   Wt Readings from Last 5 Encounters:  02/25/22 192 lb (87.1 kg)  08/20/21 193 lb 9.6 oz (87.8 kg)  07/19/21 191 lb 9.6 oz (86.9 kg)  07/12/21 192 lb (87.1 kg)  11/14/20 190 lb 8 oz (86.4 kg)    BMI Readings from Last 5 Encounters:  02/25/22 31.95 kg/m  08/20/21 32.72 kg/m  07/19/21 31.88  kg/m  07/12/21 31.95 kg/m  11/14/20 31.70 kg/m     Physical Exam General: Well-appearing, no acute distress Eyes: EOMI, icterus Neck: Supple: No JVP Pulmonary: Clear, normal work of breathing Cardiovascular: Warm, no edema Abdomen: Nondistended, bowel sounds present MSK: No synovitis, no joint effusion Neuro: normal gait, no weakness Psych: Normal mood, full affect   Assessment & Plan:   Dyspnea on exertion: There is suspicion suspicion for asthma given preceding chest x-ray in 2006 with hyperinflation, recent CT scan with mosaicism as well as atopic symptoms.  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  She does describe significant tachycardia and that seems to be the most limiting factor in terms of her dyspnea.  Her cardiac work-up to date has all been reassuring and normal.  Do suspect that this is a normal response to exercise and that most of her symptoms are likely related to deconditioning.  Discussed and recommended a graduated exercise program to slowly increase cardiovascular endurance.  Disposition had gradually improved to resolved and back to normal prior to recent COVID infection.    Prolonged bronchitis: Following recent COVID infection.  Now with dyspnea again.    Chest exam is totally clear.  No concerning symptoms otherwise such as fever weight loss etc.  Given suspicion for asthma and now prolonged bronchitis as a result, recommend short course of prednisone as well as ICS/LABA therapy.  For now, she declines both.  Given similar symptoms in the past, she is optimistic cough will improve with time.  She is likely right.  I asked her to contact us if symptoms not improving in the coming weeks and  we can reexplore therapeutic interventions as above.   Return if symptoms worsen or fail to improve.   Lanier Clam, MD 02/25/2022

## 2022-02-25 NOTE — Patient Instructions (Addendum)
No changes to medicines  I suspect the cough will improve with time, likely related to the recent viral illness and lingering inflammation  If not improving in the coming weeks let me know and we can consider inhalers like we discussed or other cough pills etc.  Return to clinic as needed

## 2022-04-25 ENCOUNTER — Encounter (HOSPITAL_COMMUNITY): Payer: Self-pay

## 2022-04-25 NOTE — Progress Notes (Addendum)
COVID Vaccine Completed:  Yes  Date of COVID positive in last 90 days:  PCP - [name], MD Cardiologist - Alverda Skeans, MD Pulmonologist - Vilma Meckel, MD  Chest x-ray - CT chest 06-06-21 Epic EKG - 07-12-21 Epic Stress Test -  ECHO - 08-01-21 Epic Cardiac Cath -  Pacemaker/ICD device last checked: Spinal Cord Stimulator: Coronary CT - 08-05-21 Epic  Bowel Prep -   Sleep Study - Yes, +sleep apnea CPAP -   Fasting Blood Sugar -  Checks Blood Sugar _____ times a day  Last dose of GLP1 agonist-  N/A GLP1 instructions:  N/A   Last dose of SGLT-2 inhibitors-  N/A SGLT-2 instructions: N/A   Blood Thinner Instructions: Aspirin Instructions: Last Dose:  Activity level:  Can go up a flight of stairs and perform activities of daily living without stopping and without symptoms of chest pain or shortness of breath.  Able to exercise without symptoms  Unable to go up a flight of stairs without symptoms of     Anesthesia review: Chest pain and dyspnea evaluated by cardiology.  Patient denies shortness of breath, fever, cough and chest pain at PAT appointment  Patient verbalized understanding of instructions that were given to them at the PAT appointment. Patient was also instructed that they will need to review over the PAT instructions again at home before surgery.

## 2022-04-25 NOTE — Patient Instructions (Addendum)
SURGICAL WAITING ROOM VISITATION Patients having surgery or a procedure may have no more than 2 support people in the waiting area - these visitors may rotate.    Children under the age of 69 must have an adult with them who is not the patient.  If the patient needs to stay at the hospital during part of their recovery, the visitor guidelines for inpatient rooms apply. Pre-op nurse will coordinate an appropriate time for 1 support person to accompany patient in pre-op.  This support person may not rotate.    Please refer to the [facility] website for the visitor guidelines for Inpatients (after your surgery is over and you are in a regular room).       Your procedure is scheduled on: 05-16-22   [facility]    Report to admitting at 9:15 AM   Call this number if you have problems the morning of surgery [phone]   Do not eat food :After Midnight.   After Midnight you may have the following liquids until 8:30 AM DAY OF SURGERY  Water Non-Citrus Juices (without pulp, NO RED) Carbonated Beverages Black Coffee (NO MILK/CREAM OR CREAMERS, sugar ok)  Clear Tea (NO MILK/CREAM OR CREAMERS, sugar ok) regular and decaf                             Plain Jell-O (NO RED)                                           Fruit ices (not with fruit pulp, NO RED)                                     Popsicles (NO RED)                                                               Sports drinks like Gatorade (NO RED)                   The day of surgery:  Drink ONE (1) Pre-Surgery Clear Ensure at 8:30 AM the morning of surgery. Drink in one sitting. Do not sip.  This drink was given to you during your hospital  pre-op appointment visit. Nothing else to drink after completing the Pre-Surgery Clear Ensure .          If you have questions, please contact your surgeon's office.   FOLLOW  ANY ADDITIONAL PRE OP INSTRUCTIONS YOU RECEIVED FROM YOUR SURGEON'S OFFICE!!!      Oral Hygiene is also important to reduce your risk of infection.                                    Remember - BRUSH YOUR TEETH THE MORNING OF SURGERY WITH YOUR REGULAR TOOTHPASTE   Do NOT smoke after Midnight   Take these medicines the morning of surgery with A SIP OF WATER:   Levothyroxine  Pantoprazole  Bring CPAP  mask and tubing day of surgery.                              You may not have any metal on your body including hair pins, jewelry, and body piercing             Do not wear make-up, lotions, powders, perfumes or deodorant  Do not wear nail polish including gel and S&S, artificial/acrylic nails, or any other type of covering on natural nails including finger and toenails. If you have artificial nails, gel coating, etc. that needs to be removed by a nail salon please have this removed prior to surgery or surgery may need to be canceled/ delayed if the surgeon/ anesthesia feels like they are unable to be safely monitored.   Do not shave  48 hours prior to surgery.    Do not bring valuables to the hospital. [facility] IS NOT RESPONSIBLE   FOR VALUABLES.   Contacts, dentures or bridgework may not be worn into surgery.  DO NOT BRING YOUR HOME MEDICATIONS TO THE HOSPITAL. PHARMACY WILL DISPENSE MEDICATIONS LISTED ON YOUR MEDICATION LIST TO YOU DURING YOUR ADMISSION IN THE HOSPITAL!    Patients discharged on the day of surgery will not be allowed to drive home.  Someone NEEDS to stay with you for the first 24 hours after anesthesia.   Special Instructions: Bring a copy of your healthcare power of attorney and living will documents the day of surgery if you haven't scanned them before.              Please read over the following fact sheets you were given: IF YOU HAVE QUESTIONS ABOUT YOUR PRE-OP INSTRUCTIONS PLEASE CALL [phone]  [place]  If you received a COVID test during your pre-op visit  it is requested that you wear a mask when out in public, stay away from anyone  that may not be feeling well and notify your surgeon if you develop symptoms. If you test positive for Covid or have been in contact with anyone that has tested positive in the last 10 days please notify you surgeon.  [facility] - Preparing for Surgery Before surgery, you can play an important role.  Because skin is not sterile, your skin needs to be as free of germs as possible.  You can reduce the number of germs on your skin by washing with CHG (chlorahexidine gluconate) soap before surgery.  CHG is an antiseptic cleaner which kills germs and bonds with the skin to continue killing germs even after washing. Please DO NOT use if you have an allergy to CHG or antibacterial soaps.  If your skin becomes reddened/irritated stop using the CHG and inform your nurse when you arrive at Short Stay. Do not shave (including legs and underarms) for at least 48 hours prior to the first CHG shower.  You may shave your face/neck.  Please follow these instructions carefully:  1.  Shower with CHG Soap the night before surgery and the  morning of surgery.  2.  If you choose to wash your hair, wash your hair first as usual with your normal  shampoo.  3.  After you shampoo, rinse your hair and body thoroughly to remove the shampoo.                             4.  Use CHG as you would  any other liquid soap.  You can apply chg directly to the skin and wash.  Gently with a scrungie or clean washcloth.  5.  Apply the CHG Soap to your body ONLY FROM THE NECK DOWN.   Do   not use on face/ open                           Wound or open sores. Avoid contact with eyes, ears mouth and   genitals (private parts).                       Wash face,  Genitals (private parts) with your normal soap.             6.  Wash thoroughly, paying special attention to the area where your    surgery  will be performed.  7.  Thoroughly rinse your body with warm water from the neck down.  8.  DO NOT shower/wash with your normal soap after using  and rinsing off the CHG Soap.                9.  Pat yourself dry with a clean towel.            10.  Wear clean pajamas.            11.  Place clean sheets on your bed the night of your first shower and do not  sleep with pets. Day of Surgery : Do not apply any lotions/deodorants the morning of surgery.  Please wear clean clothes to the hospital/surgery center.  FAILURE TO FOLLOW THESE INSTRUCTIONS MAY RESULT IN THE CANCELLATION OF YOUR SURGERY  PATIENT SIGNATURE_________________________________  NURSE SIGNATURE__________________________________  ________________________________________________________________________

## 2022-04-28 ENCOUNTER — Other Ambulatory Visit: Payer: Self-pay

## 2022-04-28 ENCOUNTER — Encounter (HOSPITAL_COMMUNITY): Payer: Self-pay

## 2022-04-28 ENCOUNTER — Encounter (HOSPITAL_COMMUNITY)
Admission: RE | Admit: 2022-04-28 | Discharge: 2022-04-28 | Disposition: A | Discharge status: Home / Self Care | Payer: Medicare Other | Source: Ambulatory Visit | Attending: Orthopedic Surgery | Admitting: Orthopedic Surgery

## 2022-04-28 VITALS — BP 134/97 | HR 84 | Temp 98.4°F | Resp 16 | Ht 65.0 in | Wt 188.4 lb

## 2022-04-28 DIAGNOSIS — Z01812 Encounter for preprocedural laboratory examination: Secondary | ICD-10-CM | POA: Insufficient documentation

## 2022-04-28 DIAGNOSIS — J984 Other disorders of lung: Secondary | ICD-10-CM | POA: Diagnosis not present

## 2022-04-28 DIAGNOSIS — D649 Anemia, unspecified: Secondary | ICD-10-CM | POA: Diagnosis not present

## 2022-04-28 HISTORY — DX: Anemia, unspecified: D64.9

## 2022-04-28 LAB — CBC
HCT: 38.1 % (ref 36.0–46.0)
Hemoglobin: 12.5 g/dL (ref 12.0–15.0)
MCH: 28.5 pg (ref 26.0–34.0)
MCHC: 32.8 g/dL (ref 30.0–36.0)
MCV: 87 fL (ref 80.0–100.0)
Platelets: 277 10*3/uL (ref 150–400)
RBC: 4.38 MIL/uL (ref 3.87–5.11)
RDW: 13.2 % (ref 11.5–15.5)
WBC: 5.6 10*3/uL (ref 4.0–10.5)
nRBC: 0 % (ref 0.0–0.2)

## 2022-04-28 LAB — BASIC METABOLIC PANEL
Anion gap: 11 (ref 5–15)
BUN: 17 mg/dL (ref 8–23)
CO2: 25 mmol/L (ref 22–32)
Calcium: 9.5 mg/dL (ref 8.9–10.3)
Chloride: 101 mmol/L (ref 98–111)
Creatinine, Ser: 0.77 mg/dL (ref 0.44–1.00)
GFR, Estimated: >60 mL/min (ref >60)
Glucose, Bld: 85 mg/dL (ref 70–99)
Potassium: 4.1 mmol/L (ref 3.5–5.1)
Sodium: 137 mmol/L (ref 135–145)

## 2022-05-01 NOTE — Anesthesia Preprocedure Evaluation (Addendum)
Anesthesia Evaluation  Patient identified by MRN, date of birth, ID band Patient awake    Reviewed: Allergy & Precautions, NPO status , Patient's Chart, lab work & pertinent test results  History of Anesthesia Complications (+) PONV and history of anesthetic complications  Airway Mallampati: III  TM Distance: >3 FB Neck ROM: Full    Dental no notable dental hx. (+) Dental Advisory Given   Pulmonary neg pulmonary ROS, neg recent URI   Pulmonary exam normal breath sounds clear to auscultation       Cardiovascular (-) angina + DOE  (-) Orthopnea Normal cardiovascular exam Rhythm:Regular Rate:Normal  Echo   1. Left ventricular ejection fraction, by estimation, is 60 to 65%. The left ventricle has normal function. The left ventricle has no regional wall motion abnormalities. Left ventricular diastolic parameters are consistent with Grade I diastolic  dysfunction (impaired relaxation). The average left ventricular global longitudinal strain is -22.9 %. The global longitudinal strain is normal.   2. Right ventricular systolic function is normal. The right ventricular size is normal. Tricuspid regurgitation signal is inadequate for assessing PA pressure.   3. The mitral valve is normal in structure. No evidence of mitral valve regurgitation.   4. The aortic valve was not well visualized. Aortic valve regurgitation is not visualized.   5. No significant aortic root or ascending aneurysm.   6. The inferior vena cava is normal in size with greater than 50% respiratory variability, suggesting right atrial pressure of 3 mmHg.   Comparison(s): No prior Echocardiogram.     Neuro/Psych  Headaches    GI/Hepatic Neg liver ROS,GERD  Medicated,,  Endo/Other  Hypothyroidism    Renal/GU negative Renal ROS     Musculoskeletal  (+) Arthritis ,    Abdominal  (+) + obese  Peds  Hematology  (+) Blood dyscrasia, anemia   Anesthesia Other  Findings   Reproductive/Obstetrics                             Anesthesia Physical Anesthesia Plan  ASA: 3  Anesthesia Plan: General   Post-op Pain Management: Tylenol PO (pre-op)*   Induction: Intravenous  PONV Risk Score and Plan: 4 or greater and Ondansetron, Dexamethasone, Treatment may vary due to age or medical condition, TIVA, Propofol infusion and Midazolam  Airway Management Planned: Oral ETT  Additional Equipment:   Intra-op Plan:   Post-operative Plan: Extubation in OR  Informed Consent: I have reviewed the patients History and Physical, chart, labs and discussed the procedure including the risks, benefits and alternatives for the proposed anesthesia with the patient or authorized representative who has indicated his/her understanding and acceptance.     Dental advisory given  Plan Discussed with: CRNA  Anesthesia Plan Comments: (See PAT note 05/01/2022)       Anesthesia Quick Evaluation

## 2022-05-01 NOTE — Progress Notes (Signed)
Anesthesia Chart Review   Case: [id number] Date/Time: 05/16/22 1115   Procedure: LEFT HIP OPEN GLUTEUS MEDIUS  REPAIR (Left)   Anesthesia type: Choice   Pre-op diagnosis: Left hip gluteus medius tendon repair   Location: [facility] ROOM 08 / [facility]   Surgeons: Yolonda Kidaogers, Jason Patrick, MD       DISCUSSION: 67 year old never smoker with history of PONV, breast cancer, scheduled for left hip gluteus medius tendon repair 05/16/2022 with Dr. Yolonda KidaJason Patrick Rogers.  Patient seen by cardiology 07/12/2021 for evaluation of dyspnea on exertion.  Echo and coronary CTA ordered at this visit.  Coronary CTA with calcium score of 0.  Echo with EF 60 to 65%, no valve problems.  Evaluated by pulmonologist.  Patient last seen by pulmonology 02/25/2022.  Dyspnea on exertion likely associated with deconditioning.  Anticipate pt can proceed with planned procedure barring acute status change.   VS: There were no vitals taken for this visit.  PROVIDERS: Chilton GreathouseAvva, Ravisankar, MD is PCP  Orbie Pyohukkani, Arun K, MD is cardiologist  Pulmonologist - Vilma MeckelMatthew Hunsucker, MD  LABS: Labs reviewed: Acceptable for surgery. (all labs ordered are listed, but only abnormal results are displayed)  Labs Reviewed - No data to display   IMAGES:   EKG:   CV: Echo 08/01/2021 1. Left ventricular ejection fraction, by estimation, is 60 to 65%. The  left ventricle has normal function. The left ventricle has no regional  wall motion abnormalities. Left ventricular diastolic parameters are  consistent with Grade I diastolic  dysfunction (impaired relaxation). The average left ventricular global  longitudinal strain is -22.9 %. The global longitudinal strain is normal.   2. Right ventricular systolic function is normal. The right ventricular  size is normal. Tricuspid regurgitation signal is inadequate for assessing  PA pressure.   3. The mitral valve is normal in structure. No evidence of mitral valve  regurgitation.   4. The aortic  valve was not well visualized. Aortic valve regurgitation  is not visualized.   5. No significant aortic root or ascending aneurysm.   6. The inferior vena cava is normal in size with greater than 50%  respiratory variability, suggesting right atrial pressure of 3 mmHg.  Past Medical History:  Diagnosis Date   Abdominal pain, unspecified site    Acute pharyngitis    Acute upper respiratory infections of unspecified site    Allergic rhinitis, cause unspecified    Anemia    Breast cancer 10/24/2015   Left breast    Degeneration of cervical intervertebral disc    Diaphragmatic hernia without mention of obstruction or gangrene    DOE (dyspnea on exertion)    Dysuria    Esophageal reflux    History of radiation therapy 03/18/16-04/15/16   left breast 42.72 Gy in 6 fractions, boost 10 Gy in 5 fractions   Migraine without aura, without mention of intractable migraine without mention of status migrainosus    Mild cardiomegaly    Nausea alone    Other constipation    Other malaise and fatigue    Personal history of radiation therapy 2018   Left Breast Cancer   PONV (postoperative nausea and vomiting)    Screening for depression    Unspecified hypothyroidism    Unspecified venous (peripheral) insufficiency    Urinary frequency     Past Surgical History:  Procedure Laterality Date   ABDOMINAL HYSTERECTOMY     BREAST BIOPSY Bilateral 10/24/2015   LCIS   BREAST BIOPSY Right 11/23/2015   lcis  BREAST EXCISIONAL BIOPSY Right 01/2016   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    levothyroxine (SYNTHROID) 112 MCG tablet   meloxicam (MOBIC) 15 MG tablet   oxymetazoline (AFRIN) 0.05 % nasal spray   pantoprazole (PROTONIX) 40 MG tablet   VITAMIN D, CHOLECALCIFEROL, PO     Jennifer Scobey Ward, PA-C [facility] [phone]

## 2022-05-16 ENCOUNTER — Ambulatory Visit (HOSPITAL_COMMUNITY): Payer: Medicare Other | Admitting: Physician Assistant

## 2022-05-16 ENCOUNTER — Encounter (HOSPITAL_COMMUNITY): Payer: Self-pay | Admitting: Orthopedic Surgery

## 2022-05-16 ENCOUNTER — Encounter (HOSPITAL_COMMUNITY)
Admission: RE | Disposition: A | Discharge status: Home / Self Care | Payer: Self-pay | Source: Ambulatory Visit | Attending: Orthopedic Surgery

## 2022-05-16 ENCOUNTER — Other Ambulatory Visit: Payer: Self-pay

## 2022-05-16 ENCOUNTER — Observation Stay (HOSPITAL_COMMUNITY)
Admission: RE | Admit: 2022-05-16 | Discharge: 2022-05-17 | Disposition: A | Discharge status: Home / Self Care | Payer: Medicare Other | Source: Ambulatory Visit | Attending: Orthopedic Surgery | Admitting: Orthopedic Surgery

## 2022-05-16 ENCOUNTER — Ambulatory Visit (HOSPITAL_BASED_OUTPATIENT_CLINIC_OR_DEPARTMENT_OTHER): Payer: Medicare Other | Admitting: Physician Assistant

## 2022-05-16 DIAGNOSIS — E039 Hypothyroidism, unspecified: Secondary | ICD-10-CM

## 2022-05-16 DIAGNOSIS — X58XXXA Exposure to other specified factors, initial encounter: Secondary | ICD-10-CM | POA: Insufficient documentation

## 2022-05-16 DIAGNOSIS — E669 Obesity, unspecified: Secondary | ICD-10-CM

## 2022-05-16 DIAGNOSIS — Z79899 Other long term (current) drug therapy: Secondary | ICD-10-CM | POA: Insufficient documentation

## 2022-05-16 DIAGNOSIS — S76012A Strain of muscle, fascia and tendon of left hip, initial encounter: Secondary | ICD-10-CM | POA: Diagnosis not present

## 2022-05-16 DIAGNOSIS — Z853 Personal history of malignant neoplasm of breast: Secondary | ICD-10-CM | POA: Insufficient documentation

## 2022-05-16 DIAGNOSIS — S76011A Strain of muscle, fascia and tendon of right hip, initial encounter: Principal | ICD-10-CM | POA: Insufficient documentation

## 2022-05-16 DIAGNOSIS — Z6831 Body mass index (BMI) 31.0-31.9, adult: Secondary | ICD-10-CM

## 2022-05-16 DIAGNOSIS — D638 Anemia in other chronic diseases classified elsewhere: Secondary | ICD-10-CM

## 2022-05-16 HISTORY — PX: GLUTEUS MINIMUS REPAIR: SHX5843

## 2022-05-16 LAB — CREATININE, SERUM
Creatinine, Ser: 0.73 mg/dL (ref 0.44–1.00)
GFR, Estimated: >60 mL/min (ref >60)

## 2022-05-16 LAB — CBC
HCT: 35.7 % — ABNORMAL LOW (ref 36.0–46.0)
Hemoglobin: 11.9 g/dL — ABNORMAL LOW (ref 12.0–15.0)
MCH: 28.7 pg (ref 26.0–34.0)
MCHC: 33.3 g/dL (ref 30.0–36.0)
MCV: 86.2 fL (ref 80.0–100.0)
Platelets: 239 10*3/uL (ref 150–400)
RBC: 4.14 MIL/uL (ref 3.87–5.11)
RDW: 12.9 % (ref 11.5–15.5)
WBC: 7.7 10*3/uL (ref 4.0–10.5)
nRBC: 0 % (ref 0.0–0.2)

## 2022-05-16 SURGERY — REPAIR, TENDON, GLUTEUS MINIMUS
Anesthesia: General | Site: Hip | Laterality: Left

## 2022-05-16 MED ORDER — CEFAZOLIN SODIUM-DEXTROSE 2-4 GM/100ML-% IV SOLN
2.0000 g | INTRAVENOUS | Status: AC
  Administered 2022-05-16: 2 g via INTRAVENOUS
  Filled 2022-05-16: qty 100

## 2022-05-16 MED ORDER — TRANEXAMIC ACID-NACL 1000-0.7 MG/100ML-% IV SOLN
1000.0000 mg | INTRAVENOUS | Status: AC
  Administered 2022-05-16: 1000 mg via INTRAVENOUS
  Filled 2022-05-16: qty 100

## 2022-05-16 MED ORDER — MORPHINE SULFATE (PF) 2 MG/ML IV SOLN: 2.0000 mg | INTRAVENOUS | Status: DC | PRN

## 2022-05-16 MED ORDER — METHOCARBAMOL 500 MG IVPB - SIMPLE MED: 500.0000 mg | Freq: Four times a day (QID) | INTRAVENOUS | Status: DC | PRN

## 2022-05-16 MED ORDER — DEXMEDETOMIDINE HCL IN NACL 80 MCG/20ML IV SOLN
INTRAVENOUS | Status: DC | PRN
  Administered 2022-05-16: 12 ug via INTRAVENOUS

## 2022-05-16 MED ORDER — OXYCODONE HCL 5 MG PO TABS: 5.0000 mg | ORAL_TABLET | ORAL | 0 refills | Status: AC | PRN

## 2022-05-16 MED ORDER — HYDROCODONE-ACETAMINOPHEN 5-325 MG PO TABS: 1.0000 | ORAL_TABLET | ORAL | Status: DC | PRN

## 2022-05-16 MED ORDER — MIDAZOLAM HCL 5 MG/5ML IJ SOLN
INTRAMUSCULAR | Status: DC | PRN
  Administered 2022-05-16: 2 mg via INTRAVENOUS

## 2022-05-16 MED ORDER — METHOCARBAMOL 750 MG PO TABS: 750.0000 mg | ORAL_TABLET | Freq: Four times a day (QID) | ORAL | 0 refills | Status: AC | PRN

## 2022-05-16 MED ORDER — ACETAMINOPHEN 500 MG PO TABS
500.0000 mg | ORAL_TABLET | Freq: Four times a day (QID) | ORAL | Status: DC
  Administered 2022-05-17 (×2): 500 mg via ORAL
  Filled 2022-05-16 (×3): qty 1

## 2022-05-16 MED ORDER — AMISULPRIDE (ANTIEMETIC) 5 MG/2ML IV SOLN: 10.0000 mg | Freq: Once | INTRAVENOUS | Status: DC | PRN

## 2022-05-16 MED ORDER — 0.9 % SODIUM CHLORIDE (POUR BTL) OPTIME
TOPICAL | Status: DC | PRN
  Administered 2022-05-16: 1000 mL

## 2022-05-16 MED ORDER — CHLORHEXIDINE GLUCONATE 0.12 % MT SOLN
15.0000 mL | Freq: Once | OROMUCOSAL | Status: AC
  Administered 2022-05-16: 15 mL via OROMUCOSAL

## 2022-05-16 MED ORDER — ACETAMINOPHEN 10 MG/ML IV SOLN
INTRAVENOUS | Status: AC
  Filled 2022-05-16: qty 100

## 2022-05-16 MED ORDER — KETOROLAC TROMETHAMINE 30 MG/ML IJ SOLN
30.0000 mg | Freq: Once | INTRAMUSCULAR | Status: AC
  Administered 2022-05-16: 30 mg via INTRAVENOUS

## 2022-05-16 MED ORDER — DEXAMETHASONE SODIUM PHOSPHATE 10 MG/ML IJ SOLN
INTRAMUSCULAR | Status: DC | PRN
  Administered 2022-05-16: 10 mg via INTRAVENOUS

## 2022-05-16 MED ORDER — ACETAMINOPHEN 10 MG/ML IV SOLN
1000.0000 mg | Freq: Once | INTRAVENOUS | Status: AC
  Administered 2022-05-16: 1000 mg via INTRAVENOUS

## 2022-05-16 MED ORDER — ONDANSETRON HCL 4 MG/2ML IJ SOLN
4.0000 mg | Freq: Four times a day (QID) | INTRAMUSCULAR | Status: DC | PRN
  Administered 2022-05-17: 4 mg via INTRAVENOUS
  Filled 2022-05-16: qty 2

## 2022-05-16 MED ORDER — METHOCARBAMOL 500 MG PO TABS
500.0000 mg | ORAL_TABLET | Freq: Four times a day (QID) | ORAL | Status: DC | PRN
  Administered 2022-05-17: 500 mg via ORAL
  Filled 2022-05-16 (×2): qty 1

## 2022-05-16 MED ORDER — PHENYLEPHRINE HCL-NACL 20-0.9 MG/250ML-% IV SOLN
INTRAVENOUS | Status: DC | PRN
  Administered 2022-05-16: 40 ug/min via INTRAVENOUS

## 2022-05-16 MED ORDER — OXYCODONE HCL 5 MG PO TABS
5.0000 mg | ORAL_TABLET | Freq: Once | ORAL | Status: AC
  Administered 2022-05-16: 5 mg via ORAL

## 2022-05-16 MED ORDER — LIDOCAINE 2% (20 MG/ML) 5 ML SYRINGE
INTRAMUSCULAR | Status: DC | PRN
  Administered 2022-05-16: 80 mg via INTRAVENOUS

## 2022-05-16 MED ORDER — EPHEDRINE 5 MG/ML INJ
INTRAVENOUS | Status: AC
  Filled 2022-05-16: qty 15

## 2022-05-16 MED ORDER — KETOROLAC TROMETHAMINE 30 MG/ML IJ SOLN
INTRAMUSCULAR | Status: AC
  Filled 2022-05-16: qty 1

## 2022-05-16 MED ORDER — PHENYLEPHRINE HCL (PRESSORS) 10 MG/ML IV SOLN
INTRAVENOUS | Status: AC
  Filled 2022-05-16: qty 1

## 2022-05-16 MED ORDER — KETOROLAC TROMETHAMINE 30 MG/ML IJ SOLN: 15.0000 mg | Freq: Once | INTRAMUSCULAR | Status: DC

## 2022-05-16 MED ORDER — ROCURONIUM BROMIDE 10 MG/ML (PF) SYRINGE
PREFILLED_SYRINGE | INTRAVENOUS | Status: AC
  Filled 2022-05-16: qty 10

## 2022-05-16 MED ORDER — FENTANYL CITRATE PF 50 MCG/ML IJ SOSY
25.0000 ug | PREFILLED_SYRINGE | INTRAMUSCULAR | Status: DC | PRN
  Administered 2022-05-16 (×3): 50 ug via INTRAVENOUS

## 2022-05-16 MED ORDER — ONDANSETRON HCL 4 MG/2ML IJ SOLN
INTRAMUSCULAR | Status: DC | PRN
  Administered 2022-05-16: 4 mg via INTRAVENOUS

## 2022-05-16 MED ORDER — ONDANSETRON HCL 4 MG PO TABS: 4.0000 mg | ORAL_TABLET | Freq: Four times a day (QID) | ORAL | Status: DC | PRN

## 2022-05-16 MED ORDER — ACETAMINOPHEN 500 MG PO TABS: 1000.0000 mg | ORAL_TABLET | Freq: Once | ORAL | Status: DC

## 2022-05-16 MED ORDER — OXYCODONE HCL 5 MG PO TABS
ORAL_TABLET | ORAL | Status: AC
  Filled 2022-05-16: qty 1

## 2022-05-16 MED ORDER — FENTANYL CITRATE PF 50 MCG/ML IJ SOSY
PREFILLED_SYRINGE | INTRAMUSCULAR | Status: AC
  Filled 2022-05-16: qty 3

## 2022-05-16 MED ORDER — LEVOTHYROXINE SODIUM 112 MCG PO TABS
112.0000 ug | ORAL_TABLET | Freq: Every day | ORAL | Status: DC
  Administered 2022-05-17: 112 ug via ORAL
  Filled 2022-05-16: qty 1

## 2022-05-16 MED ORDER — SUGAMMADEX SODIUM 200 MG/2ML IV SOLN
INTRAVENOUS | Status: DC | PRN
  Administered 2022-05-16: 200 mg via INTRAVENOUS

## 2022-05-16 MED ORDER — PHENYLEPHRINE 80 MCG/ML (10ML) SYRINGE FOR IV PUSH (FOR BLOOD PRESSURE SUPPORT)
PREFILLED_SYRINGE | INTRAVENOUS | Status: AC
  Filled 2022-05-16: qty 40

## 2022-05-16 MED ORDER — METHOCARBAMOL 500 MG PO TABS
ORAL_TABLET | ORAL | Status: AC
  Filled 2022-05-16: qty 1

## 2022-05-16 MED ORDER — PROMETHAZINE HCL 25 MG/ML IJ SOLN: 6.2500 mg | INTRAMUSCULAR | Status: DC | PRN

## 2022-05-16 MED ORDER — PROPOFOL 10 MG/ML IV BOLUS
INTRAVENOUS | Status: DC | PRN
  Administered 2022-05-16: 150 mg via INTRAVENOUS

## 2022-05-16 MED ORDER — ACETAMINOPHEN 325 MG PO TABS
325.0000 mg | ORAL_TABLET | Freq: Four times a day (QID) | ORAL | Status: DC | PRN
  Filled 2022-05-16: qty 1

## 2022-05-16 MED ORDER — PANTOPRAZOLE SODIUM 40 MG PO TBEC
40.0000 mg | DELAYED_RELEASE_TABLET | Freq: Every day | ORAL | Status: DC
  Administered 2022-05-17: 40 mg via ORAL
  Filled 2022-05-16: qty 1

## 2022-05-16 MED ORDER — PROPOFOL 10 MG/ML IV BOLUS
INTRAVENOUS | Status: AC
  Filled 2022-05-16: qty 20

## 2022-05-16 MED ORDER — CHOLECALCIFEROL 10 MCG (400 UNIT) PO TABS
1000.0000 [IU] | ORAL_TABLET | Freq: Every day | ORAL | Status: DC
  Administered 2022-05-17: 1000 [IU] via ORAL
  Filled 2022-05-16: qty 3

## 2022-05-16 MED ORDER — ORAL CARE MOUTH RINSE: 15.0000 mL | Freq: Once | OROMUCOSAL | Status: AC

## 2022-05-16 MED ORDER — ONDANSETRON HCL 4 MG PO TABS: 4.0000 mg | ORAL_TABLET | Freq: Three times a day (TID) | ORAL | 0 refills | Status: AC | PRN

## 2022-05-16 MED ORDER — PHENYLEPHRINE 80 MCG/ML (10ML) SYRINGE FOR IV PUSH (FOR BLOOD PRESSURE SUPPORT)
PREFILLED_SYRINGE | INTRAVENOUS | Status: DC | PRN
  Administered 2022-05-16: 160 ug via INTRAVENOUS
  Administered 2022-05-16: 80 ug via INTRAVENOUS
  Administered 2022-05-16 (×2): 160 ug via INTRAVENOUS

## 2022-05-16 MED ORDER — FENTANYL CITRATE (PF) 100 MCG/2ML IJ SOLN
INTRAMUSCULAR | Status: DC | PRN
  Administered 2022-05-16 (×4): 50 ug via INTRAVENOUS

## 2022-05-16 MED ORDER — METHOCARBAMOL 500 MG PO TABS
500.0000 mg | ORAL_TABLET | Freq: Once | ORAL | Status: AC
  Administered 2022-05-16: 500 mg via ORAL

## 2022-05-16 MED ORDER — MIDAZOLAM HCL 2 MG/2ML IJ SOLN
INTRAMUSCULAR | Status: AC
  Filled 2022-05-16: qty 2

## 2022-05-16 MED ORDER — ROCURONIUM BROMIDE 10 MG/ML (PF) SYRINGE
PREFILLED_SYRINGE | INTRAVENOUS | Status: DC | PRN
  Administered 2022-05-16: 60 mg via INTRAVENOUS

## 2022-05-16 MED ORDER — FENTANYL CITRATE (PF) 100 MCG/2ML IJ SOLN
INTRAMUSCULAR | Status: AC
  Filled 2022-05-16: qty 2

## 2022-05-16 MED ORDER — ENOXAPARIN SODIUM 40 MG/0.4ML IJ SOSY
40.0000 mg | PREFILLED_SYRINGE | INTRAMUSCULAR | Status: DC
  Administered 2022-05-17: 40 mg via SUBCUTANEOUS
  Filled 2022-05-16: qty 0.4

## 2022-05-16 MED ORDER — HYDROCODONE-ACETAMINOPHEN 7.5-325 MG PO TABS
1.0000 | ORAL_TABLET | ORAL | Status: DC | PRN
  Administered 2022-05-17: 1 via ORAL
  Filled 2022-05-16 (×2): qty 1

## 2022-05-16 MED ORDER — LACTATED RINGERS IV SOLN: INTRAVENOUS | Status: DC

## 2022-05-16 SURGICAL SUPPLY — 44 items
ANCH SUT 2 SWLK 19.1 CLS EYLT (Anchor) ×2 IMPLANT
ANCH SUT 2.6 FBRTK 1.7 (Anchor) ×1 IMPLANT
ANCH SUT 2.6 FBRTK 1.7 KNTLS (Anchor) ×1 IMPLANT
ANCHOR FIBERTAK RC 2.6 (BLUE) (Anchor) IMPLANT
ANCHOR SWIVELOCK BIO 4.75X19.1 (Anchor) IMPLANT
BAG COUNTER SPONGE SURGICOUNT (BAG) IMPLANT
BAG SPEC THK2 15X12 ZIP CLS (MISCELLANEOUS) ×1
BAG SPNG CNTER NS LX DISP (BAG)
BAG ZIPLOCK 12X15 (MISCELLANEOUS) ×2 IMPLANT
CLSR STERI-STRIP ANTIMIC 1/2X4 (GAUZE/BANDAGES/DRESSINGS) IMPLANT
COVER SURGICAL LIGHT HANDLE (MISCELLANEOUS) ×2 IMPLANT
DRAPE SURG 17X11 SM STRL (DRAPES) ×2 IMPLANT
DRAPE U-SHAPE 47X51 STRL (DRAPES) ×2 IMPLANT
DRSG AQUACEL AG ADV 3.5X10 (GAUZE/BANDAGES/DRESSINGS) IMPLANT
DURAPREP 26ML APPLICATOR (WOUND CARE) ×2 IMPLANT
ELECT REM PT RETURN 15FT ADLT (MISCELLANEOUS) ×2 IMPLANT
GLOVE BIO SURGEON STRL SZ7.5 (GLOVE) ×4 IMPLANT
GLOVE BIOGEL PI IND STRL 8 (GLOVE) ×4 IMPLANT
GOWN STRL REUS W/ TWL XL LVL3 (GOWN DISPOSABLE) ×4 IMPLANT
GOWN STRL REUS W/TWL XL LVL3 (GOWN DISPOSABLE) ×2
IMPL FIBERTAK KNTLS 2.6 (Anchor) IMPLANT
IMPLANT FIBERTAK KNTLS 2.6 (Anchor) ×1 IMPLANT
KIT ANCHOR FBRTK 2.6 STR (KITS) IMPLANT
KIT BASIN OR (CUSTOM PROCEDURE TRAY) ×2 IMPLANT
KIT TURNOVER KIT A (KITS) IMPLANT
MANIFOLD NEPTUNE II (INSTRUMENTS) ×2 IMPLANT
NDL SCORPION MULTI FIRE (NEEDLE) IMPLANT
NEEDLE SCORPION MULTI FIRE (NEEDLE) ×1 IMPLANT
NS IRRIG 1000ML POUR BTL (IV SOLUTION) ×2 IMPLANT
PACK TOTAL JOINT (CUSTOM PROCEDURE TRAY) ×2 IMPLANT
PIN SET MODULAR GLENOID SYSTEM (PIN) IMPLANT
PROTECTOR NERVE ULNAR (MISCELLANEOUS) ×2 IMPLANT
SUT ETHIBOND NAB CT1 #1 30IN (SUTURE) IMPLANT
SUT FIBERWIRE #2 38 T-5 BLUE (SUTURE)
SUT MNCRL AB 3-0 PS2 18 (SUTURE) ×2 IMPLANT
SUT STRATAFIX 1PDS 45CM VIOLET (SUTURE) IMPLANT
SUT VIC AB 1 CT1 27 (SUTURE) ×1
SUT VIC AB 1 CT1 27XBRD ANTBC (SUTURE) ×2 IMPLANT
SUT VIC AB 2-0 CT1 27 (SUTURE) ×1
SUT VIC AB 2-0 CT1 TAPERPNT 27 (SUTURE) ×2 IMPLANT
SUTURE FIBERWR #2 38 T-5 BLUE (SUTURE) IMPLANT
SUTURE TAPE TIGERLINK 1.3MM BL (SUTURE) IMPLANT
SUTURETAPE TIGERLINK 1.3MM BL (SUTURE) ×1
TOWEL OR 17X26 10 PK STRL BLUE (TOWEL DISPOSABLE) ×4 IMPLANT

## 2022-05-16 NOTE — Discharge Instructions (Signed)
Post-operative patient instructions    Ice:  Place intermittent ice or cooler pack over your HIP, 30 minutes on and 30 minutes off.  Continue this for the first 72 hours after surgery, then save ice for use after therapy sessions or on more active days.   Weight:  you can weight bear with crutches, 50% to operative leg for 4 weeks.  AVOID moving your hip/leg AWAY from your body  DVT prevention: Perform ankle pumps as able throughout the day on the operative extremity.  Be mobile as possible with ambulation as able.  You should also take an 81 mg aspirin once per day x6 weeks. Crutches:  Use crutches (or walker) to assist in walking  Dressing:  Your dressin is waterproof and you may begin showering on postoperative day #3.  Do not submerge underwater. Shower:  Light shower is ok after 3 days.  Please take shower, NO bath. .   Pain medication:  A narcotic pain medication has been prescribed.  Take as directed.  Typically you need narcotic pain medication more regularly during the first 3 to 5 days after surgery.  Decrease your use of the medication as the pain improves.  Narcotics can sometimes cause constipation, even after a few doses.  If you have problems with constipation, you can take an over the counter stool softener or light laxative.  If you have persistent problems, please notify your physician's office. Physical therapy: Additional activity guidelines to be provided by your physician or physical therapist at follow-up visits.  Driving: Do not recommend driving x 1-2 weeks post surgical, especially if surgery performed on right side. Should not drive while taking narcotic pain medications. It typically takes at least 2 weeks to restore sufficient neuromuscular function for normal reaction times for driving safety.  Call [phone] for questions or problems. Evenings you will be forwarded to the hospital operator.  Ask for the orthopaedic physician on call. Please call if you experience:     Redness, foul smelling, or persistent drainage from the surgical site  worsening knee pain and swelling not responsive to medication  any calf pain and or swelling of the lower leg  temperatures greater than 101.5 F other questions or concerns   Thank you for allowing [place] to be a part of your care

## 2022-05-16 NOTE — Brief Op Note (Signed)
05/16/2022  3:15 PM  PATIENT:  Clydene S Lakeman  67 y.o. female  PRE-OPERATIVE DIAGNOSIS:  Left hip gluteus medius tendon repair  POST-OPERATIVE DIAGNOSIS:  Left hip gluteus medius tendon repair  PROCEDURE:  Procedure(s): LEFT HIP OPEN GLUTEUS MEDIUS  REPAIR (Left)  SURGEON:  Surgeon(s) and Role:    * Yolonda Kida, MD - Primary  PHYSICIAN ASSISTANT:  Dion Saucier, PA-C  ANESTHESIA:   local and general  EBL:  20 cc  BLOOD ADMINISTERED:none  DRAINS: none   LOCAL MEDICATIONS USED:  MARCAINE     SPECIMEN:  No Specimen  DISPOSITION OF SPECIMEN:  N/A  COUNTS:  YES  TOURNIQUET:  * No tourniquets in log *  DICTATION: .Note written in EPIC  PLAN OF CARE: Discharge to home after PACU  PATIENT DISPOSITION:  PACU - hemodynamically stable.   Delay start of Pharmacological VTE agent (>24hrs) due to surgical blood loss or risk of bleeding: not applicable

## 2022-05-16 NOTE — Anesthesia Procedure Notes (Signed)
Procedure Name: Intubation Date/Time: 05/16/2022 2:13 PM  Performed by: Basilio Cairo, CRNAPre-anesthesia Checklist: Patient identified, Patient being monitored, Timeout performed, Emergency Drugs available and Suction available Patient Re-evaluated:Patient Re-evaluated prior to induction Oxygen Delivery Method: Circle system utilized Preoxygenation: Pre-oxygenation with 100% oxygen Induction Type: IV induction Ventilation: Mask ventilation without difficulty Laryngoscope Size: Mac and 3 Grade View: Grade I Tube type: Oral Tube size: 7.0 mm Number of attempts: 1 Airway Equipment and Method: Stylet Placement Confirmation: ETT inserted through vocal cords under direct vision, positive ETCO2 and breath sounds checked- equal and bilateral Secured at: 21 cm Tube secured with: Tape Dental Injury: Teeth and Oropharynx as per pre-operative assessment

## 2022-05-16 NOTE — Transfer of Care (Signed)
Immediate Anesthesia Transfer of Care Note  Patient: Jennifer Rodgers  Procedure(s) Performed: LEFT HIP OPEN GLUTEUS MEDIUS  REPAIR (Left: Hip)  Patient Location: PACU  Anesthesia Type:General  Level of Consciousness: awake, alert , and oriented  Airway & Oxygen Therapy: Patient Spontanous Breathing and Patient connected to face mask oxygen  Post-op Assessment: Report given to RN, Post -op Vital signs reviewed and stable, and Patient moving all extremities X 4  Post vital signs: Reviewed and stable  Last Vitals:  Vitals Value Taken Time  BP 131/91 05/16/22 1530  Temp    Pulse 88 05/16/22 1531  Resp 11 05/16/22 1531  SpO2 100 % 05/16/22 1531  Vitals shown include unvalidated device data.  Last Pain:  Vitals:   05/16/22 1141  TempSrc:   PainSc: 0-No pain         Complications: No notable events documented.

## 2022-05-16 NOTE — H&P (Signed)
ORTHOPAEDIC H and P  REQUESTING PHYSICIAN: Yolonda Kida, MD  PCP:  [name], MD  Chief Complaint: Left gluteus medius tendon tear  HPI: Jennifer Rodgers is a 67 y.o. female who complains of left hip pain including gait abnormalities and weakness.  She is been dealing with these issues for the last 6 to 7 months.  She has had recalcitrant symptoms despite conservative treatment.  Here today for open gluteus tendon repair.  Past Medical History:  Diagnosis Date   Abdominal pain, unspecified site    Acute pharyngitis    Acute upper respiratory infections of unspecified site    Allergic rhinitis, cause unspecified    Anemia    Breast cancer (HCC) 10/24/2015   Left breast    Degeneration of cervical intervertebral disc    Diaphragmatic hernia without mention of obstruction or gangrene    DOE (dyspnea on exertion)    Dysuria    Esophageal reflux    History of radiation therapy 03/18/16-04/15/16   left breast 42.72 Gy in 6 fractions, boost 10 Gy in 5 fractions   Migraine without aura, without mention of intractable migraine without mention of status migrainosus    Mild cardiomegaly    Nausea alone    Other constipation    Other malaise and fatigue    Personal history of radiation therapy 2018   Left Breast Cancer   PONV (postoperative nausea and vomiting)    Screening for depression    Unspecified hypothyroidism    Unspecified venous (peripheral) insufficiency    Urinary frequency    Past Surgical History:  Procedure Laterality Date   ABDOMINAL HYSTERECTOMY     BREAST BIOPSY Bilateral 10/24/2015   LCIS   BREAST BIOPSY Right 11/23/2015   lcis   BREAST EXCISIONAL BIOPSY Right 01/2016   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   BREAST LUMPECTOMY WITH NEEDLE LOCALIZATION Bilateral 01/31/2016   Procedure: RIGHT BREAST LUMPECTOMY WITH DOUBLE NEEDLE LOCALIZATION, LEFT BREAST NEEDLE LOCALIZED LUMPECTOMY;  Surgeon: Harriette Bouillon, MD;  Location: [facility];  Service: General;  Laterality: Bilateral;   COLONOSCOPY  03/03/2011   LUMBAR LAMINECTOMY  2005   SALPINGOOPHORECTOMY  2004   SHOULDER ARTHROSCOPY Left 2008   THYROID SURGERY  2006   resection of L nodule   Social History   Socioeconomic History   Marital status: Married    Spouse name: Not on file   Number of children: 1   Years of education: Not on file   Highest education level: Not on file  Occupational History   Occupation: HR  Tobacco Use   Smoking status: Never   Smokeless tobacco: Never  Vaping Use   Vaping Use: Never used  Substance and Sexual Activity   Alcohol use: Yes    Alcohol/week: 0.5 standard drinks of alcohol    Types: 1 Standard drinks or equivalent per week    Comment: Rare   Drug use: No   Sexual activity: Not on file  Other Topics Concern   Not on file  Social History Narrative   Not on file   Social Determinants of Health   Financial Resource Strain: Not on file  Food Insecurity: Not on file  Transportation Needs: Not on file  Physical Activity: Not on file  Stress: Not on file  Social Connections: Not on file   Family History  Problem Relation Age of Onset   Hypertension Mother    Thyroid disease Mother    Hyperlipidemia Mother  Breast cancer Mother 17   Hypertension Sister    Hyperlipidemia Sister    Diabetes Sister    Hypertension Daughter    Hyperlipidemia Daughter    Allergies  Allergen Reactions   Prednisone Other (See Comments)    Extreme facial flushing and confusion   Hydromorphone Itching   Prior to Admission medications   Medication Sig Start Date End Date Taking? Authorizing Provider  levothyroxine (SYNTHROID) 112 MCG tablet Take 112 mcg by mouth daily before breakfast. 04/20/21  Yes [provider]  pantoprazole (PROTONIX) 40 MG tablet Take 40 mg by mouth daily.   Yes [provider]  meloxicam (MOBIC) 15 MG tablet Take 15 mg by mouth daily. 01/15/22   [provider]   oxymetazoline (AFRIN) 0.05 % nasal spray Place 1 spray into both nostrils at bedtime.    [provider]  VITAMIN D, CHOLECALCIFEROL, PO Take 1,000 Units by mouth daily.     [provider]   No results found.  Positive ROS: All other systems have been reviewed and were otherwise negative with the exception of those mentioned in the HPI and as above.  Physical Exam: General: Alert, no acute distress Cardiovascular: No pedal edema Respiratory: No cyanosis, no use of accessory musculature GI: No organomegaly, abdomen is soft and non-tender Skin: No lesions in the area of chief complaint Neurologic: Sensation intact distally Psychiatric: Patient is competent for consent with normal mood and affect Lymphatic: No axillary or cervical lymphadenopathy  MUSCULOSKELETAL: Left lower extremity is warm and well-perfused with no open wounds or lesions.  She is neurovascular intact.  Assessment: Left gluteus medius tendon tear, complete.  Plan: Plan to proceed today with open gluteus medius tendon repair.  We again discussed the risk and benefits of the procedure which include but are not limited to bleeding, infection, damage to surrounding nerves and vessels, failure of repair, stiffness, fracture, dislocation, DVT, as well as the risk of anesthesia.  She has provided informed consent.  -Plan for 50% weightbearing to the operative extremity postoperatively with avoiding active abduction.  She will discharge home from PACU.    Yolonda Kida, MD Cell [phone]    05/16/2022 1:29 PM

## 2022-05-16 NOTE — Op Note (Signed)
Date of Surgery: 03/28/2022   INDICATIONS: Jennifer Rodgers is a very nice 67 year old female who is here today for right hip surgery.  She has been dealing with Trendelenburg gait and lateral hip pain for the last few months up to a year.  She has had failure of improvement with conservative treatment options and is here today for open gluteus medius tendon repair.  We did discuss risk of bleeding, infection, damage to surrounding nerves and vessels as well as the risk of persistent pain and weakness and need for revision surgery, we also discussed the risk of DVT and the risk of anesthesia.  She has provided informed consent.     PREOPERATIVE DIAGNOSIS:  Left gluteus medius tear   POSTOPERATIVE DIAGNOSIS: Same.   PROCEDURE:  Open repair of gluteus medius tendon Left hip with suture anchors 2. Left hip open bursectomy   SURGEON: Maryan Rued, M.D.   ASSIST: Dion Saucier, PA-C  Assistant attestation:  PA McClung present for the entire procedure.   ANESTHESIA:  general   IV FLUIDS AND URINE: See anesthesia.   ESTIMATED BLOOD LOSS: 10 mL.   IMPLANTS:  2.6 mm rotator cuff fiber tack Arthrex suture anchor x 2 4.75 mm Arthrex swivel lock anchor x 2   DRAINS: None   COMPLICATIONS: None.   DESCRIPTION OF PROCEDURE: The patient was brought to the operating room and placed supine on the operating table.  The patient had been signed prior to the procedure and this was documented. The patient had the anesthesia placed by the anesthesiologist.  A time-out was performed to confirm that this was the correct patient, site, side and location. The patient did receive antibiotics prior to the incision and was re-dosed during the procedure as needed at indicated intervals.  A tourniquet was not placed.  We then placed the patient in the right lateral decubitus position and the with a Allen well arm holder and a well-padded upper and lower extremities.  The patient had the operative extremity prepped and  draped in the standard surgical fashion.        We then began the procedure with a direct lateral approach to the greater trochanter.  We dissected down in a curvilinear fashion into the subcutaneous tissues.  We identified the iliotibial band as well as the gluteus maximus muscle fascia.  We made a longitudinal incision along the fibers of the gluteus maximus as well as the IT band.  We then bluntly dissected deep.  Deep retractors were placed.  We encountered a rush of fluid.  This was clear fluid consistent with bursitis and the presence of the gluteal tendon tear.  We then noted abundant and inflamed trochanteric bursitis.  This was excised sharply with the Bovie and pickup.   We then worked down to the level of the greater trochanter.  We noted there was still some superficial attachments noted of the gluteus medius tendon.  We were able to palpate the defect deep to that.  We thus elected to make a longitudinal split.  We did note that the undersurface of the gluteus medius tendon was completely torn and total surface area of the tear was approximately 90%.  We then prepared an area on the anterior and then posterior aspects of the greater trochanter for placement of our medial row anchors.  Next we placed our retractors anterior posterior to the trochanter and moved more proximally identified a anterior tear of the gluteus medius but posterior fibers intact.  We then prepared a  small area on the anterior proximal greater trochanter for our repair site.  Next we placed our proximal row anchors for a double row repair.  These were 2 separate 2.6 mm fiber tack rotator cuff Arthrex suture anchors preloaded with  #5 working suture.  We created an interlocking suture bridge with a #5 between the 2 respective suture anchors which created a nice medial compression.  We were left with 4 total limbs of fiber tape sutures that were Deis wedged.  This created a good grasping suture and securely held the tendon tear.     We then used the remaining  suture limbs which were all fiber tapes passed these into a two separate 4.75 mm swivel lock anchors to provide further compression along the distal tendon footprint.  We predrilled the lateral cortex of the proximal femur with a 4 mm drill bit to receive each of the 4.75 mm anchors.  This was placed and had good compression..   We then irrigated the wound copiously.  We then began closing in layers.  We closed the IT band and gluteus maximus fascia with #1 strata fix.  We then irrigated once again.  We then placed a gram of vancomycin powder into the subcutaneous tissue.  Subcutaneous fat was closed with 1 Vicryl, 2-0 Monocryl for deep dermal layer and 3-0 subcuticular running Monocryl for skin.   The leg was cleaned and dried and a standard sterile bandage was applied with Aquacel.  There were no noted complications.  All counts were correct x 2.  The patient was awakened from general anesthetic and transported to PACU in stable condition.   POSTOPERATIVE PLAN:  Jennifer Rodgers will be touchdown weightbearing to the operative extremity with crutches for the first 2 weeks.  She will then progress to 50% weightbearing from week 2-6 and will limit active abduction.  She will also sleep with a pillow between the knees.  She will discharge home today from PACU and I will see her in the office in 2 weeks for wound check.  She will be on an 81 mg aspirin once a day x 6 weeks for DVT prophylaxis.

## 2022-05-17 DIAGNOSIS — S76011A Strain of muscle, fascia and tendon of right hip, initial encounter: Secondary | ICD-10-CM | POA: Diagnosis not present

## 2022-05-17 NOTE — Plan of Care (Signed)

## 2022-05-17 NOTE — Progress Notes (Addendum)
    Subjective: Patient seen in rounds for Dr. Aundria Rud.  Patient reports pain as moderate.  Currently denies N/V/CP/SOB/Abd pain. She states she was having postoperative vomiting and nausea yesterday and had to stay overnight. She states she is not currently nauseated and has not vomited today, however she has not had anything to eat and drink.   She states she is having burning pain down the leg to her feet.   Objective:   VITALS:   Vitals:   05/16/22 1855 05/16/22 2055 05/17/22 0147 05/17/22 0603  BP: 121/80 122/75 135/80 116/64  Pulse: 79 81 86 95  Resp: 18 18 18 18   Temp: 97.8 F (36.6 C) (!) 97.5 F (36.4 C) 97.7 F (36.5 C) 98 F (36.7 C)  TempSrc: Oral Oral Oral Oral  SpO2: 100% 95% 100% 98%  Weight:      Height:        Patient lying in bed. NAD.  Neurologically intact ABD soft Neurovascular intact Sensation intact distally Intact pulses distally Dorsiflexion/Plantar flexion intact Incision: dressing C/D/I No cellulitis present Compartment soft   Lab Results  Component Value Date   WBC 7.7 05/16/2022   HGB 11.9 (L) 05/16/2022   HCT 35.7 (L) 05/16/2022   MCV 86.2 05/16/2022   PLT 239 05/16/2022   BMET    Component Value Date/Time   NA 137 04/28/2022 1323   NA 141 09/16/2016 1310   K 4.1 04/28/2022 1323   K 3.8 09/16/2016 1310   CL 101 04/28/2022 1323   CO2 25 04/28/2022 1323   CO2 28 09/16/2016 1310   GLUCOSE 85 04/28/2022 1323   GLUCOSE 112 09/16/2016 1310   BUN 17 04/28/2022 1323   BUN 13.8 09/16/2016 1310   CREATININE 0.73 05/16/2022 1937   CREATININE 0.97 11/14/2020 1255   CREATININE 0.8 09/16/2016 1310   CALCIUM 9.5 04/28/2022 1323   CALCIUM 9.7 09/16/2016 1310   EGFR 74 (L) 09/16/2016 1310   GFRNONAA >60 05/16/2022 1937   GFRNONAA >60 11/14/2020 1255     Assessment/Plan: 1 Day Post-Op   Principal Problem:   Tear of left gluteus medius tendon, initial encounter   TDWB with crutches.  DVT ppx: Lovenox, SCDs, TEDS PO pain  control Dispo: D/c pending N/V and pain control.    Clois Dupes, PA-C 05/17/2022, 12:56 PM   [facility]  [place] [address], [address], [place], [place] [address] Phone: [phone] [email or web address] Facebook  [employer]

## 2022-05-17 NOTE — Discharge Summary (Signed)
Physician Discharge Summary  Patient ID: Jennifer Rodgers MRN: [id number] DOB/AGE: Nov 23, 1955 67 y.o.  Admit date: 05/16/2022 Discharge date: 05/17/2022  Admission Diagnoses:  Tear of left gluteus medius tendon, initial encounter  Discharge Diagnoses:  Principal Problem:   Tear of left gluteus medius tendon, initial encounter   Past Medical History:  Diagnosis Date   Abdominal pain, unspecified site    Acute pharyngitis    Acute upper respiratory infections of unspecified site    Allergic rhinitis, cause unspecified    Anemia    Breast cancer (HCC) 10/24/2015   Left breast    Degeneration of cervical intervertebral disc    Diaphragmatic hernia without mention of obstruction or gangrene    DOE (dyspnea on exertion)    Dysuria    Esophageal reflux    History of radiation therapy 03/18/16-04/15/16   left breast 42.72 Gy in 6 fractions, boost 10 Gy in 5 fractions   Migraine without aura, without mention of intractable migraine without mention of status migrainosus    Mild cardiomegaly    Nausea alone    Other constipation    Other malaise and fatigue    Personal history of radiation therapy 2018   Left Breast Cancer   PONV (postoperative nausea and vomiting)    Screening for depression    Unspecified hypothyroidism    Unspecified venous (peripheral) insufficiency    Urinary frequency     Surgeries: Procedure(s): LEFT HIP OPEN GLUTEUS MEDIUS  REPAIR on 05/16/2022   Consultants (if any):   Discharged Condition: Improved  Hospital Course: Jennifer Rodgers is an 67 y.o. female who was admitted 05/16/2022 with a diagnosis of Tear of left gluteus medius tendon, initial encounter and went to the operating room on 05/16/2022 and underwent the above named procedures.    She was given perioperative antibiotics:  Anti-infectives (From admission, onward)    Start     Dose/Rate Route Frequency Ordered Stop   05/16/22 1130  ceFAZolin (ANCEF) IVPB 2g/100 mL premix        2 g 200 mL/hr  over 30 Minutes Intravenous On call to O.R. 05/16/22 1116 05/16/22 1435       She was given sequential compression devices, early ambulation, and lovenox for DVT prophylaxis.  Patient kept overnight due to postoperative nausea and vomiting.  POD#1 Patient was feeling better and nausea and vomiting resolved. She was able to keep fluids and food down. Patient discharged home. Follow-up in office.   She benefited maximally from the hospital stay and there were no complications.    Recent vital signs:  Vitals:   05/17/22 0147 05/17/22 0603  BP: 135/80 116/64  Pulse: 86 95  Resp: 18 18  Temp: 97.7 F (36.5 C) 98 F (36.7 C)  SpO2: 100% 98%    Recent laboratory studies:  Lab Results  Component Value Date   HGB 11.9 (L) 05/16/2022   HGB 12.5 04/28/2022   HGB 11.8 (L) 11/14/2020   Lab Results  Component Value Date   WBC 7.7 05/16/2022   PLT 239 05/16/2022   Lab Results  Component Value Date   INR 1.0 08/04/2006   Lab Results  Component Value Date   NA 137 04/28/2022   K 4.1 04/28/2022   CL 101 04/28/2022   CO2 25 04/28/2022   BUN 17 04/28/2022   CREATININE 0.73 05/16/2022   GLUCOSE 85 04/28/2022     Allergies as of 05/17/2022       Reactions   Prednisone Other (See  Comments)   Extreme facial flushing and confusion   Hydromorphone Itching        Medication List     TAKE these medications    levothyroxine 112 MCG tablet Commonly known as: SYNTHROID Take 112 mcg by mouth daily before breakfast.   meloxicam 15 MG tablet Commonly known as: MOBIC Take 15 mg by mouth daily.   methocarbamol 750 MG tablet Commonly known as: Robaxin-750 Take 1 tablet (750 mg total) by mouth every 6 (six) hours as needed for muscle spasms.   ondansetron 4 MG tablet Commonly known as: Zofran Take 1 tablet (4 mg total) by mouth every 8 (eight) hours as needed for nausea or vomiting.   oxyCODONE 5 MG immediate release tablet Commonly known as: Roxicodone Take 1 tablet (5  mg total) by mouth every 4 (four) hours as needed for severe pain.   oxymetazoline 0.05 % nasal spray Commonly known as: AFRIN Place 1 spray into both nostrils at bedtime.   pantoprazole 40 MG tablet Commonly known as: PROTONIX Take 40 mg by mouth daily.   VITAMIN D (CHOLECALCIFEROL) PO Take 1,000 Units by mouth daily.          WEIGHT BEARING   Other:  Touchdown weightbearing for 2 weeks. AVOID moving your hip/leg AWAY from your body       CONSTIPATION  Constipation is defined medically as fewer than three stools per week and severe constipation as less than one stool per week.  Even if you have a regular bowel pattern at home, your normal regimen is likely to be disrupted due to multiple reasons following surgery.  Combination of anesthesia, postoperative narcotics, change in appetite and fluid intake all can affect your bowels.   YOU MUST use at least one of the following options; they are listed in order of increasing strength to get the job done.  They are all available over the counter, and you may need to use some, POSSIBLY even all of these options:    Drink plenty of fluids (prune juice may be helpful) and high fiber foods Colace 100 mg by mouth twice a day  Senokot for constipation as directed and as needed Dulcolax (bisacodyl), take with full glass of water  Miralax (polyethylene glycol) once or twice a day as needed.  If you have tried all these things and are unable to have a bowel movement in the first 3-4 days after surgery call either your surgeon or your primary doctor.    If you experience loose stools or diarrhea, hold the medications until you stool forms back up.  If your symptoms do not get better within 1 week or if they get worse, check with your doctor.  If you experience "the worst abdominal pain ever" or develop nausea or vomiting, please contact the office immediately for further recommendations for treatment.   ITCHING:  If you experience itching  with your medications, try taking only a single pain pill, or even half a pain pill at a time.  You can also use Benadryl over the counter for itching or also to help with sleep.    MEDICATIONS:  See your medication summary on the "After Visit Summary" that nursing will review with you.  You may have some home medications which will be placed on hold until you complete the course of blood thinner medication.  It is important for you to complete the blood thinner medication as prescribed.  PRECAUTIONS:  If you experience chest pain or shortness of breath -  call 911 immediately for transfer to the hospital emergency department.   If you develop a fever greater that 101 F, purulent drainage from wound, increased redness or drainage from wound, foul odor from the wound/dressing, or calf pain - CONTACT YOUR SURGEON.                                                   FOLLOW-UP APPOINTMENTS:  If you do not already have a post-op appointment, please call the office for an appointment to be seen by your surgeon.  Guidelines for how soon to be seen are listed in your "After Visit Summary", but are typically between 1-4 weeks after surgery.   MAKE SURE YOU:  Understand these instructions.  Get help right away if you are not doing well or get worse.    Thank you for letting us be a part of your medical care team.  It is a privilege we respect greatly.  We hope these instructions will help you stay on track for a fast and full recovery!   Diagnostic Studies: No results found.  Disposition: Discharge disposition: 01-Home or Self Care       Discharge Instructions     Call MD / Call 911   Complete by: As directed    If you experience chest pain or shortness of breath, CALL 911 and be transported to the hospital emergency room.  If you develope a fever above 101 F, pus (white drainage) or increased drainage or redness at the wound, or calf pain, call your surgeon's office.   Constipation Prevention    Complete by: As directed    Drink plenty of fluids.  Prune juice may be helpful.  You may use a stool softener, such as Colace (over the counter) 100 mg twice a day.  Use MiraLax (over the counter) for constipation as needed.   Diet - low sodium heart healthy   Complete by: As directed    Increase activity slowly as tolerated   Complete by: As directed    Post-operative opioid taper instructions:   Complete by: As directed    POST-OPERATIVE OPIOID TAPER INSTRUCTIONS: It is important to wean off of your opioid medication as soon as possible. If you do not need pain medication after your surgery it is ok to stop day one. Opioids include: Codeine, Hydrocodone(Norco, Vicodin), Oxycodone(Percocet, oxycontin) and hydromorphone amongst others.  Long term and even short term use of opiods can cause: Increased pain response Dependence Constipation Depression Respiratory depression And more.  Withdrawal symptoms can include Flu like symptoms Nausea, vomiting And more Techniques to manage these symptoms Hydrate well Eat regular healthy meals Stay active Use relaxation techniques(deep breathing, meditating, yoga) Do Not substitute Alcohol to help with tapering If you have been on opioids for less than two weeks and do not have pain than it is ok to stop all together.  Plan to wean off of opioids This plan should start within one week post op of your joint replacement. Maintain the same interval or time between taking each dose and first decrease the dose.  Cut the total daily intake of opioids by one tablet each day Next start to increase the time between doses. The last dose that should be eliminated is the evening dose.      Walker rolling   Complete by: As directed  Signed: Clois Dupes 05/17/2022, 3:53 PM

## 2022-05-17 NOTE — TOC CM/SW Note (Signed)
Transition of Care (TOC) Screening Note  Patient Details  Name: Jennifer Rodgers Date of Birth: 1955/12/04  Transition of Care [facility]) CM/SW Contact:    Ewing Schlein, LCSW Phone Number: 05/17/2022, 9:20 AM  Transition of Care Department [facility]) has reviewed patient and no TOC needs have been identified at this time. We will continue to monitor patient advancement through interdisciplinary progression rounds. If new patient transition needs arise, please place a TOC consult.

## 2022-05-17 NOTE — Progress Notes (Signed)
Nurse reviewed discharge instructions for pt. Pt verbalized understanding of discharge instructions, follow up appointments and new medications.  No concerns at time of discharge. Waiting for walker to be delivered

## 2022-05-19 ENCOUNTER — Encounter (HOSPITAL_COMMUNITY): Payer: Self-pay | Admitting: Orthopedic Surgery

## 2022-05-19 NOTE — Anesthesia Postprocedure Evaluation (Signed)
Anesthesia Post Note  Patient: Jennifer Rodgers  Procedure(s) Performed: LEFT HIP OPEN GLUTEUS MEDIUS  REPAIR (Left: Hip)     Patient location during evaluation: PACU Anesthesia Type: General Level of consciousness: awake and alert Pain management: pain level controlled Vital Signs Assessment: post-procedure vital signs reviewed and stable Respiratory status: spontaneous breathing, nonlabored ventilation, respiratory function stable and patient connected to nasal cannula oxygen Cardiovascular status: blood pressure returned to baseline and stable Postop Assessment: no apparent nausea or vomiting Anesthetic complications: no  No notable events documented.  Last Vitals:  Vitals:   05/17/22 0147 05/17/22 0603  BP: 135/80 116/64  Pulse: 86 95  Resp: 18 18  Temp: 36.5 C 36.7 C  SpO2: 100% 98%    Last Pain:  Vitals:   05/17/22 1222  TempSrc:   PainSc: 3                  Trevor Iha

## 2022-11-21 ENCOUNTER — Other Ambulatory Visit: Payer: Self-pay | Admitting: Internal Medicine

## 2022-11-21 DIAGNOSIS — Z1231 Encounter for screening mammogram for malignant neoplasm of breast: Secondary | ICD-10-CM

## 2022-12-24 ENCOUNTER — Ambulatory Visit: Payer: Medicare Other

## 2022-12-30 ENCOUNTER — Ambulatory Visit
Admission: RE | Admit: 2022-12-30 | Discharge: 2022-12-30 | Disposition: A | Discharge status: Home / Self Care | Payer: Medicare Other | Source: Ambulatory Visit | Attending: Internal Medicine | Admitting: Internal Medicine

## 2022-12-30 DIAGNOSIS — Z1231 Encounter for screening mammogram for malignant neoplasm of breast: Secondary | ICD-10-CM

## 2023-03-17 DIAGNOSIS — M25552 Pain in left hip: Secondary | ICD-10-CM | POA: Diagnosis not present

## 2023-04-27 DIAGNOSIS — J209 Acute bronchitis, unspecified: Secondary | ICD-10-CM | POA: Diagnosis not present

## 2023-04-27 DIAGNOSIS — R058 Other specified cough: Secondary | ICD-10-CM | POA: Diagnosis not present

## 2023-04-27 DIAGNOSIS — R0981 Nasal congestion: Secondary | ICD-10-CM | POA: Diagnosis not present

## 2023-05-18 DIAGNOSIS — H11153 Pinguecula, bilateral: Secondary | ICD-10-CM | POA: Diagnosis not present

## 2023-05-18 DIAGNOSIS — H2513 Age-related nuclear cataract, bilateral: Secondary | ICD-10-CM | POA: Diagnosis not present

## 2023-05-18 DIAGNOSIS — H02052 Trichiasis without entropian right lower eyelid: Secondary | ICD-10-CM | POA: Diagnosis not present

## 2023-06-26 DIAGNOSIS — E039 Hypothyroidism, unspecified: Secondary | ICD-10-CM | POA: Diagnosis not present

## 2023-06-26 DIAGNOSIS — M8580 Other specified disorders of bone density and structure, unspecified site: Secondary | ICD-10-CM | POA: Diagnosis not present

## 2023-06-26 DIAGNOSIS — R7301 Impaired fasting glucose: Secondary | ICD-10-CM | POA: Diagnosis not present

## 2023-06-26 DIAGNOSIS — Z1212 Encounter for screening for malignant neoplasm of rectum: Secondary | ICD-10-CM | POA: Diagnosis not present

## 2023-06-26 DIAGNOSIS — E785 Hyperlipidemia, unspecified: Secondary | ICD-10-CM | POA: Diagnosis not present

## 2023-07-07 DIAGNOSIS — Z1331 Encounter for screening for depression: Secondary | ICD-10-CM | POA: Diagnosis not present

## 2023-07-07 DIAGNOSIS — E039 Hypothyroidism, unspecified: Secondary | ICD-10-CM | POA: Diagnosis not present

## 2023-07-07 DIAGNOSIS — Z0000 Encounter for general adult medical examination without abnormal findings: Secondary | ICD-10-CM | POA: Diagnosis not present

## 2023-07-07 DIAGNOSIS — Z1339 Encounter for screening examination for other mental health and behavioral disorders: Secondary | ICD-10-CM | POA: Diagnosis not present

## 2023-07-07 DIAGNOSIS — R82998 Other abnormal findings in urine: Secondary | ICD-10-CM | POA: Diagnosis not present

## 2023-07-08 DIAGNOSIS — K080 Exfoliation of teeth due to systemic causes: Secondary | ICD-10-CM | POA: Diagnosis not present

## 2023-10-22 DIAGNOSIS — E039 Hypothyroidism, unspecified: Secondary | ICD-10-CM | POA: Diagnosis not present

## 2023-11-10 ENCOUNTER — Encounter: Payer: Self-pay | Admitting: Internal Medicine

## 2023-11-10 DIAGNOSIS — Z1231 Encounter for screening mammogram for malignant neoplasm of breast: Secondary | ICD-10-CM

## 2023-11-12 DIAGNOSIS — Z86000 Personal history of in-situ neoplasm of breast: Secondary | ICD-10-CM | POA: Diagnosis not present

## 2023-11-12 DIAGNOSIS — N6459 Other signs and symptoms in breast: Secondary | ICD-10-CM | POA: Diagnosis not present

## 2023-11-13 ENCOUNTER — Other Ambulatory Visit: Payer: Self-pay | Admitting: Gynecology

## 2023-11-13 DIAGNOSIS — N6459 Other signs and symptoms in breast: Secondary | ICD-10-CM

## 2023-11-25 ENCOUNTER — Ambulatory Visit
Admission: RE | Admit: 2023-11-25 | Discharge: 2023-11-25 | Disposition: A | Discharge status: Home / Self Care | Source: Ambulatory Visit | Attending: Gynecology | Admitting: Gynecology

## 2023-11-25 ENCOUNTER — Ambulatory Visit
Admission: RE | Admit: 2023-11-25 | Discharge: 2023-11-25 | Disposition: A | Source: Ambulatory Visit | Attending: Gynecology | Admitting: Gynecology

## 2023-11-25 DIAGNOSIS — R928 Other abnormal and inconclusive findings on diagnostic imaging of breast: Secondary | ICD-10-CM | POA: Diagnosis not present

## 2023-11-25 DIAGNOSIS — N6459 Other signs and symptoms in breast: Secondary | ICD-10-CM

## 2023-11-25 DIAGNOSIS — N6489 Other specified disorders of breast: Secondary | ICD-10-CM | POA: Diagnosis not present

## 2023-12-24 DIAGNOSIS — C801 Malignant (primary) neoplasm, unspecified: Secondary | ICD-10-CM | POA: Diagnosis not present

## 2024-02-04 ENCOUNTER — Other Ambulatory Visit: Payer: Self-pay | Admitting: Gynecology

## 2024-02-04 DIAGNOSIS — Z1231 Encounter for screening mammogram for malignant neoplasm of breast: Secondary | ICD-10-CM

## 2024-02-16 ENCOUNTER — Ambulatory Visit
